# Patient Record
Sex: Male | Born: 1945 | Race: Black or African American | Hispanic: No | Marital: Married | Smoking: Never smoker
Health system: Southern US, Community
[De-identification: ages and names within clinical notes are randomized; demographics above are authoritative.]

## PROBLEM LIST (undated history)

## (undated) DIAGNOSIS — C801 Malignant (primary) neoplasm, unspecified: Secondary | ICD-10-CM

## (undated) DIAGNOSIS — I10 Essential (primary) hypertension: Secondary | ICD-10-CM

## (undated) HISTORY — PX: PROSTATE SURGERY: SHX751

## (undated) HISTORY — DX: Essential (primary) hypertension: I10

## (undated) HISTORY — DX: Malignant (primary) neoplasm, unspecified: C80.1

---

## 1999-08-27 ENCOUNTER — Emergency Department (HOSPITAL_COMMUNITY): Admission: EM | Admit: 1999-08-27 | Discharge: 1999-08-27 | Payer: Self-pay | Admitting: Internal Medicine

## 2000-04-17 ENCOUNTER — Emergency Department (HOSPITAL_COMMUNITY): Admission: EM | Admit: 2000-04-17 | Discharge: 2000-04-17 | Payer: Self-pay | Admitting: Emergency Medicine

## 2005-07-19 ENCOUNTER — Ambulatory Visit: Admission: RE | Admit: 2005-07-19 | Discharge: 2005-09-28 | Payer: Self-pay | Admitting: Radiation Oncology

## 2007-01-31 ENCOUNTER — Ambulatory Visit: Payer: Self-pay | Admitting: Gastroenterology

## 2007-02-12 ENCOUNTER — Encounter: Payer: Self-pay | Admitting: Gastroenterology

## 2007-02-12 ENCOUNTER — Ambulatory Visit: Payer: Self-pay | Admitting: Gastroenterology

## 2007-02-12 DIAGNOSIS — D126 Benign neoplasm of colon, unspecified: Secondary | ICD-10-CM

## 2007-02-12 HISTORY — DX: Benign neoplasm of colon, unspecified: D12.6

## 2007-09-24 DIAGNOSIS — K921 Melena: Secondary | ICD-10-CM

## 2007-09-24 DIAGNOSIS — Z8546 Personal history of malignant neoplasm of prostate: Secondary | ICD-10-CM

## 2007-09-24 DIAGNOSIS — I10 Essential (primary) hypertension: Secondary | ICD-10-CM

## 2007-09-24 DIAGNOSIS — M129 Arthropathy, unspecified: Secondary | ICD-10-CM | POA: Insufficient documentation

## 2007-09-24 HISTORY — DX: Arthropathy, unspecified: M12.9

## 2007-09-24 HISTORY — DX: Essential (primary) hypertension: I10

## 2007-09-24 HISTORY — DX: Melena: K92.1

## 2007-09-24 HISTORY — DX: Personal history of malignant neoplasm of prostate: Z85.46

## 2010-01-14 ENCOUNTER — Encounter: Payer: Self-pay | Admitting: Gastroenterology

## 2010-07-22 NOTE — Letter (Signed)
Summary: Colonoscopy Letter  [facility]  [address] [place], [place] [address]   Phone: [phone]  Fax: [phone]      January 14, 2010 MRN: [id number]   WACEY ZIEGER [address] [place], [place]  [address]   Dear Mr. ANZALONE,   According to your medical record, it is time for you to schedule a Colonoscopy. The American Cancer Society recommends this procedure as a method to detect early colon cancer. Patients with a family history of colon cancer, or a personal history of colon polyps or inflammatory bowel disease are at increased risk.  This letter has been generated based on the recommendations made at the time of your procedure. If you feel that in your particular situation this may no longer apply, please contact our office.  Please call our office at [phone] to schedule this appointment or to update your records at your earliest convenience.  Thank you for cooperating with [place] to provide you with the very best care possible.   Sincerely,  Rachael Fee, M.D.   [facility] [phone]

## 2010-11-02 NOTE — Assessment & Plan Note (Signed)
[facility]                         GASTROENTEROLOGY OFFICE NOTE   David, Rios                       MRN:          [id number]  DATE:01/31/2007                            DOB:          08-04-1945    REFERRING PHYSICIAN:  Jonita Albee, M.D.   Dr. Perrin Maltese asked me to evaluate David Rios in consultation regarding heme-  positive stool.   HISTORY OF PRESENT ILLNESS:  [name] is a very pleasant 65 year old  gentleman who believes he had a colonoscopy in 2002.  This was done at  [facility].  He does not remember the name of the physician, but he  was under the understanding that it was normal.  Since then, he was  diagnosed with prostate cancer approximately 3 to 4 years ago.  He  underwent surgery and radiation to his prostate bed.  He has never seen  overt blood in his stool.  He has no constipation or diarrhea.  He went  in for a routine physical and was found to have heme-positive stool.   REVIEW OF SYSTEMS:  Notable for stable weight.  Otherwise essentially  normal and is available on his nursing intake sheet.   PAST MEDICAL HISTORY:  1. Hypertension.  2. Arthritis.  3. Prostate cancer, as above, status post surgery and radiation      approximately 3 years ago.   CURRENT MEDICATIONS:  Lisinopril and aspirin.   ALLERGIES:  NO KNOWN DRUG ALLERGIES.   SOCIAL HISTORY:  Married with 5 children.  Retired from the [employer].  Nonsmoker, nondrinker.   FAMILY HISTORY:  No colon cancer, colon polyps.  His brother had  prostate cancer.   PHYSICAL EXAMINATION:  VITAL SIGNS:  6 feet 0 inches, 189 pounds.  Blood  pressure 120/76, pulse 68.  CONSTITUTIONAL:  Generally well-appearing.  NEUROLOGIC:  Alert and oriented x3.  HEENT:  Extraocular movements intact.  Oropharynx moist with no lesions.  NECK:  Supple.  No lymphadenopathy.  CARDIOVASCULAR:  Heart was regular rate and rhythm.  LUNGS:  Clear to auscultation bilaterally.  ABDOMEN:  Soft, nontender, nondistended.  Normal bowel sounds.  EXTREMITIES:  No lower extremity edema.  SKIN:  No rashes or lesions of his lower extremities.   ASSESSMENT AND PLAN:  65 year old man with recent heme-positive stool.   He did have what sounds like a colonoscopy 5-6 years ago.  I do not see  any reports on the [employer], and I also do not find any  documentation of this in E-chart.  He does not remember who did it but  believes that it was done at [facility].  Either way, he has  heme-positive stool, and it was about 5-6 years from the time of that  colonoscopy so we should repeat it.  If it is normal at this time, then  he would not need another one for 10 years given that he is at average  risk for colorectal cancer.  It is definitely possible that he has some  minor radiation damage from the prostate radiation that could account  for his  heme-positive stool.     Rachael Fee, MD  Electronically Signed    DPJ/MedQ  DD: 01/31/2007  DT: 02/01/2007  Job #: [id number]   cc:   Jonita Albee, M.D.

## 2011-08-25 DIAGNOSIS — C61 Malignant neoplasm of prostate: Secondary | ICD-10-CM | POA: Diagnosis not present

## 2011-09-05 DIAGNOSIS — N35919 Unspecified urethral stricture, male, unspecified site: Secondary | ICD-10-CM | POA: Diagnosis not present

## 2011-09-05 DIAGNOSIS — C61 Malignant neoplasm of prostate: Secondary | ICD-10-CM | POA: Diagnosis not present

## 2011-09-05 DIAGNOSIS — R3129 Other microscopic hematuria: Secondary | ICD-10-CM | POA: Diagnosis not present

## 2011-09-05 DIAGNOSIS — R319 Hematuria, unspecified: Secondary | ICD-10-CM | POA: Diagnosis not present

## 2011-10-03 ENCOUNTER — Ambulatory Visit (INDEPENDENT_AMBULATORY_CARE_PROVIDER_SITE_OTHER): Payer: Medicare Other | Admitting: Internal Medicine

## 2011-10-03 ENCOUNTER — Encounter: Payer: Self-pay | Admitting: Internal Medicine

## 2011-10-03 VITALS — BP 144/86 | HR 63 | Temp 98.0°F | Resp 16 | Ht 70.75 in | Wt 184.2 lb

## 2011-10-03 DIAGNOSIS — E785 Hyperlipidemia, unspecified: Secondary | ICD-10-CM

## 2011-10-03 DIAGNOSIS — Z79899 Other long term (current) drug therapy: Secondary | ICD-10-CM | POA: Diagnosis not present

## 2011-10-03 DIAGNOSIS — I10 Essential (primary) hypertension: Secondary | ICD-10-CM

## 2011-10-03 MED ORDER — LISINOPRIL 5 MG PO TABS: 5.0000 mg | ORAL_TABLET | Freq: Every day | ORAL | Status: DC

## 2011-10-03 NOTE — Progress Notes (Signed)
  Subjective:    Patient ID: David Rios, male    DOB: Sep 29, 1945, 66 y.o.   MRN: [id number]  HPI HTN well controlled, Exercises more No problems   Review of Systems stable   Objective:   Physical Exam  Constitutional: He is oriented to person, place, and time. He appears well-developed and well-nourished.  Neck: Normal range of motion. No thyromegaly present.  Cardiovascular: Normal rate, regular rhythm and normal heart sounds.   Pulmonary/Chest: Effort normal and breath sounds normal.  Abdominal: Soft. There is no tenderness.  Neurological: He is alert and oriented to person, place, and time.  Psychiatric: He has a normal mood and affect.          Assessment & Plan:  HTN OK Prostate Cancer recent hematuria under eval by urology CPE

## 2011-10-04 LAB — CBC WITH DIFFERENTIAL/PLATELET
Basophils Absolute: 0 10*3/uL (ref 0.0–0.1)
Basophils Relative: 1 % (ref 0–1)
Eosinophils Absolute: 0.2 10*3/uL (ref 0.0–0.7)
Hemoglobin: 13 g/dL (ref 13.0–17.0)
MCH: 29 pg (ref 26.0–34.0)
MCHC: 32.7 g/dL (ref 30.0–36.0)
Monocytes Absolute: 0.5 10*3/uL (ref 0.1–1.0)
Monocytes Relative: 14 % — ABNORMAL HIGH (ref 3–12)
Neutro Abs: 2.3 10*3/uL (ref 1.7–7.7)
Neutrophils Relative %: 64 % (ref 43–77)
RDW: 13.2 % (ref 11.5–15.5)

## 2011-10-04 LAB — COMPREHENSIVE METABOLIC PANEL
ALT: 16 U/L (ref 0–53)
AST: 19 U/L (ref 0–37)
Albumin: 4.4 g/dL (ref 3.5–5.2)
Alkaline Phosphatase: 79 U/L (ref 39–117)
Calcium: 9.3 mg/dL (ref 8.4–10.5)
Chloride: 106 mEq/L (ref 96–112)
Potassium: 4.6 mEq/L (ref 3.5–5.3)
Sodium: 142 mEq/L (ref 135–145)
Total Protein: 6.7 g/dL (ref 6.0–8.3)

## 2011-10-13 DIAGNOSIS — C61 Malignant neoplasm of prostate: Secondary | ICD-10-CM | POA: Diagnosis not present

## 2012-01-02 ENCOUNTER — Encounter: Payer: Medicare Other | Admitting: Internal Medicine

## 2012-01-12 DIAGNOSIS — C61 Malignant neoplasm of prostate: Secondary | ICD-10-CM | POA: Diagnosis not present

## 2012-01-16 ENCOUNTER — Encounter: Payer: Medicare Other | Admitting: Internal Medicine

## 2012-02-15 ENCOUNTER — Telehealth: Payer: Self-pay | Admitting: Gastroenterology

## 2012-02-15 ENCOUNTER — Encounter: Payer: Self-pay | Admitting: Gastroenterology

## 2012-02-15 NOTE — Telephone Encounter (Signed)
Recall Project: No contact with patient, letter mailed

## 2012-02-23 DIAGNOSIS — C61 Malignant neoplasm of prostate: Secondary | ICD-10-CM | POA: Diagnosis not present

## 2012-03-26 ENCOUNTER — Encounter: Payer: Medicare Other | Admitting: Internal Medicine

## 2012-06-07 DIAGNOSIS — C61 Malignant neoplasm of prostate: Secondary | ICD-10-CM | POA: Diagnosis not present

## 2012-07-05 DIAGNOSIS — C61 Malignant neoplasm of prostate: Secondary | ICD-10-CM | POA: Diagnosis not present

## 2012-07-16 ENCOUNTER — Encounter: Payer: Self-pay | Admitting: Internal Medicine

## 2012-07-16 ENCOUNTER — Ambulatory Visit (INDEPENDENT_AMBULATORY_CARE_PROVIDER_SITE_OTHER): Payer: Medicare Other | Admitting: Internal Medicine

## 2012-07-16 VITALS — BP 148/86 | HR 75 | Temp 97.7°F | Resp 16 | Ht 70.0 in | Wt 184.0 lb

## 2012-07-16 DIAGNOSIS — I10 Essential (primary) hypertension: Secondary | ICD-10-CM | POA: Diagnosis not present

## 2012-07-16 DIAGNOSIS — C61 Malignant neoplasm of prostate: Secondary | ICD-10-CM

## 2012-07-16 DIAGNOSIS — Z7189 Other specified counseling: Secondary | ICD-10-CM

## 2012-07-16 DIAGNOSIS — Z0000 Encounter for general adult medical examination without abnormal findings: Secondary | ICD-10-CM | POA: Diagnosis not present

## 2012-07-16 DIAGNOSIS — Z79899 Other long term (current) drug therapy: Secondary | ICD-10-CM

## 2012-07-16 LAB — CBC WITH DIFFERENTIAL/PLATELET
Basophils Absolute: 0 10*3/uL (ref 0.0–0.1)
Eosinophils Absolute: 0.1 10*3/uL (ref 0.0–0.7)
Eosinophils Relative: 4 % (ref 0–5)
Lymphs Abs: 0.8 10*3/uL (ref 0.7–4.0)
MCH: 30.3 pg (ref 26.0–34.0)
MCV: 83.9 fL (ref 78.0–100.0)
Neutrophils Relative %: 66 % (ref 43–77)
Platelets: 202 10*3/uL (ref 150–400)
RBC: 4.59 MIL/uL (ref 4.22–5.81)
RDW: 14.3 % (ref 11.5–15.5)
WBC: 3.9 10*3/uL — ABNORMAL LOW (ref 4.0–10.5)

## 2012-07-16 LAB — LIPID PANEL
Cholesterol: 190 mg/dL (ref 0–200)
HDL: 46 mg/dL (ref >39)
LDL Cholesterol: 132 mg/dL — ABNORMAL HIGH (ref 0–99)
Total CHOL/HDL Ratio: 4.1 Ratio
Triglycerides: 60 mg/dL (ref <150)
VLDL: 12 mg/dL (ref 0–40)

## 2012-07-16 LAB — POCT URINALYSIS DIPSTICK
Nitrite, UA: NEGATIVE
Protein, UA: NEGATIVE
Urobilinogen, UA: 0.2
pH, UA: 5

## 2012-07-16 LAB — POCT UA - MICROSCOPIC ONLY
Casts, Ur, LPF, POC: NEGATIVE
Epithelial cells, urine per micros: NEGATIVE
Mucus, UA: NEGATIVE
Yeast, UA: NEGATIVE

## 2012-07-16 LAB — COMPREHENSIVE METABOLIC PANEL
ALT: 17 U/L (ref 0–53)
AST: 23 U/L (ref 0–37)
Alkaline Phosphatase: 82 U/L (ref 39–117)
Creat: 1.17 mg/dL (ref 0.50–1.35)
Sodium: 141 mEq/L (ref 135–145)
Total Bilirubin: 0.9 mg/dL (ref 0.3–1.2)
Total Protein: 7.4 g/dL (ref 6.0–8.3)

## 2012-07-16 MED ORDER — LISINOPRIL 10 MG PO TABS: 10.0000 mg | ORAL_TABLET | Freq: Every day | ORAL | Status: DC

## 2012-07-16 NOTE — Progress Notes (Signed)
  Subjective:    Patient ID: David Rios, male    DOB: [birth year], 67 y.o.   MRN: [id number]  HPI Feels good. Prostate cancer shot once per month, has sweats as side affect. No other problems.  Able to get erections.  Review of Systems  Constitutional: Positive for diaphoresis.  HENT: Negative.   Eyes: Negative.   Respiratory: Negative.   Cardiovascular: Negative.   Gastrointestinal: Negative.   Genitourinary: Negative for urgency, decreased urine volume, enuresis and difficulty urinating.  Musculoskeletal: Negative.   Neurological: Negative.   Psychiatric/Behavioral: Negative.        Objective:   Physical Exam  Constitutional: He is oriented to person, place, and time. He appears well-developed and well-nourished. No distress.  HENT:  Right Ear: External ear normal.  Left Ear: External ear normal.  Nose: Nose normal.  Mouth/Throat: Oropharynx is clear and moist.  Neck: Normal range of motion. Neck supple.  Cardiovascular: Normal rate, regular rhythm, normal heart sounds and intact distal pulses.   Pulmonary/Chest: Effort normal and breath sounds normal.  Abdominal: Soft. Bowel sounds are normal.  Genitourinary: Rectum normal, prostate normal and penis normal. Right testis shows no mass and no tenderness. Left testis shows no mass and no tenderness. Uncircumcised.       Testes are atrophied  Musculoskeletal: Normal range of motion.  Neurological: He is alert and oriented to person, place, and time. He has normal reflexes. No cranial nerve deficit. He exhibits normal muscle tone. Coordination normal.  Skin: Skin is warm and dry.  Psychiatric: He has a normal mood and affect. His behavior is normal. Judgment and thought content normal.   Results for orders placed in visit on 07/16/12  POCT UA - MICROSCOPIC ONLY      Component Value Range   WBC, Ur, HPF, POC neg     RBC, urine, microscopic 0-2     Bacteria, U Microscopic trace     Mucus, UA neg     Epithelial cells,  urine per micros neg     Crystals, Ur, HPF, POC neg     Casts, Ur, LPF, POC neg     Yeast, UA neg    POCT URINALYSIS DIPSTICK      Component Value Range   Color, UA yellow     Clarity, UA clear     Glucose, UA neg     Bilirubin, UA neg     Ketones, UA neg     Spec Grav, UA >=1.030     Blood, UA trace     pH, UA 5.0     Protein, UA neg     Urobilinogen, UA 0.2     Nitrite, UA neg     Leukocytes, UA Negative            Assessment & Plan:  Prostate cancer controlled HTN slipping/ incr lisinopril to 10mg  qd RF meds 1 year

## 2012-07-16 NOTE — Progress Notes (Signed)
  Subjective:    Patient ID: David Rios, male    DOB: [birth year], 67 y.o.   MRN: [id number]  HPI    Review of Systems  Constitutional: Positive for diaphoresis.  HENT: Negative.   Eyes: Negative.   Respiratory: Negative.   Cardiovascular: Negative.   Gastrointestinal: Negative.   Genitourinary: Negative.   Musculoskeletal: Negative.   Neurological: Negative.   Hematological: Negative.   Psychiatric/Behavioral: Negative.        Objective:   Physical Exam        Assessment & Plan:

## 2012-07-16 NOTE — Patient Instructions (Addendum)

## 2012-10-11 DIAGNOSIS — C61 Malignant neoplasm of prostate: Secondary | ICD-10-CM | POA: Diagnosis not present

## 2012-11-08 DIAGNOSIS — C61 Malignant neoplasm of prostate: Secondary | ICD-10-CM | POA: Diagnosis not present

## 2013-02-20 DIAGNOSIS — C61 Malignant neoplasm of prostate: Secondary | ICD-10-CM | POA: Diagnosis not present

## 2013-03-08 ENCOUNTER — Ambulatory Visit: Payer: Medicare Other

## 2013-03-08 ENCOUNTER — Ambulatory Visit (INDEPENDENT_AMBULATORY_CARE_PROVIDER_SITE_OTHER): Payer: Medicare Other | Admitting: Internal Medicine

## 2013-03-08 VITALS — BP 154/80 | HR 79 | Temp 97.1°F | Resp 18 | Ht 71.5 in | Wt 188.0 lb

## 2013-03-08 DIAGNOSIS — M779 Enthesopathy, unspecified: Secondary | ICD-10-CM

## 2013-03-08 DIAGNOSIS — M7150 Other bursitis, not elsewhere classified, unspecified site: Secondary | ICD-10-CM | POA: Diagnosis not present

## 2013-03-08 DIAGNOSIS — M25519 Pain in unspecified shoulder: Secondary | ICD-10-CM | POA: Diagnosis not present

## 2013-03-08 DIAGNOSIS — M719 Bursopathy, unspecified: Secondary | ICD-10-CM

## 2013-03-08 DIAGNOSIS — M25512 Pain in left shoulder: Secondary | ICD-10-CM

## 2013-03-08 MED ORDER — CELECOXIB 200 MG PO CAPS: 200.0000 mg | ORAL_CAPSULE | Freq: Two times a day (BID) | ORAL | Status: DC

## 2013-03-08 NOTE — Patient Instructions (Addendum)
Surgery for Impingement Syndrome, Subacromial Decompression Subacromial decompression surgery is for patients with rotator cuff tendinitis, subacromial bursitis (inflamed fluid filled sac between the shoulder joint and top of the shoulder blade), or impingement syndrome (inflamed rotator cuff tendons, due to pinching). Surgery is for patients with continued shoulder pain, despite at least 3 months of rehabilitation treatment. The shoulder pain is so severe that it affects patients' daily activities or greatly decreases their quality of life. Patients who will benefit most from surgery are those whose shoulder bone (acromion) has a curve, hook, or bump (spur), or those who have a partial rotator cuff tear. There are 3 purposes of surgery. First, the inflamed bursa is removed. Second, the shoulder bone defect (curve, hook, spur) is removed. Third, the coracoacromial ligament is cut. This surgery is intended to reduce pain, by increasing space in the area, so that the rotator cuff is less likely to be pinched. REASONS NOT TO OPERATE   Infection of the shoulder joint.  Patient is unable or unwilling to complete the postoperative program. This includes keeping the shoulder in a sling or immobilizer (if open surgery is performed), or performing the needed rehabilitation.  Emotional or psychological conditions that contribute to the shoulder condition.  Patients who have rotator cuff inflammation due to other causes. This includes impingement caused by shoulder instability, weak shoulder blade muscles (scapula), shoulder arthritis, stiff or frozen shoulder, or a large os acromionale (failure of the shoulder bone growth plates to fuse properly). RISKS AND COMPLICATIONS   Infection.  Bleeding.  Injury to nerves (numbness, weakness, paralysis).  Continued or recurring pain.  Detachment of the deltoid shoulder muscle (if open surgery is performed).  Stiffness or loss of shoulder motion.  Decrease in  athletic performance.  Shoulder weakness.  Fracture of the shoulder bone.  Pain in the joint connecting the shoulder bone and collarbone.  Removal of too much or too little shoulder bone. TECHNIQUE Technique used may vary between surgeons. In general, the surgery is performed with a flexible tube and tools inserted in a few small slits near the joint (arthroscopic). It may also be completed through an open cut (incision). The goal of the procedure is to remove the bursa, remove the shoulder bone deformity, and cut the coracoacromial ligament. Electricity will be used to sear the small capillaries (cauterize), to stop small amounts of bleeding. Other tools used are an electric or motorized shaver, to remove the bursa, and a small power drill (burr) to remove the deformity of the shoulder bone.  If the procedure is completed with an open incision, the surgeon will detach the deltoid shoulder muscle from the shoulder bone and cut the coracoacromial ligament. The deformity of the shoulder bone is then removed, using a saw or chisel (osteotome). A file (rasp) may be used to smooth the edges. Finally, the bursa is removed with scissors, and the deltoid muscle is reattached to the shoulder bone.  HOME CARE INSTRUCTIONS   Postoperative care depends on the surgical technique used (arthroscopic or open).  Follow the instructions given to you by your surgeon.  Keep the wound clean and dry for 10 to 14 days after surgery, especially if open surgery is performed.  Wear a sling, brace, or immobilizer as prescribed by your surgeon. This often lasts a couple days for arthroscopic procedures, or 6 to 8 weeks for open procedures, because the deltoid muscle must heal.  You will be given pain medicines by your caregiver or surgeon. Take only as much medicine  as you need.  You may be advised to perform motion exercises immediately after surgery. These may be performed at home or with a therapist.  Postoperative  rehabilitation and exercises are very important to regain motion, and later, strength. RETURN TO SPORTS   6 weeks is the minimum waiting time required before returning to play. Open procedure surgeries are often longer.  Return to sports depends on the type of sport and the position played.  A therapist must assess your strength and range of motion before athletics may be resumed. SEEK MEDICAL CARE IF:   You experience pain, numbness, or coldness in the hand.  Blue, gray, or dark color appears in the fingernails.  Any of the following occur after surgery:  Increased pain, swelling, redness, drainage of fluids, or bleeding in the affected area.  Signs of infection (headache, muscle aches, dizziness, a general ill feeling with fever).  New, unexplained symptoms develop. (Drugs used in treatment may produce side effects.) Do not eat or drink anything before surgery. Solid food makes general anesthesia more hazardous.  Document Released: 06/06/2005 Document Revised: 08/29/2011 Document Reviewed: 09/18/2008 [facility] Patient Information 2014 [place], [place]. Adhesive Capsulitis Sometimes the shoulder becomes stiff and is painful to move. Some people say it feels as if the shoulder is frozen in place. Because of this, the condition is called "frozen shoulder." Its medical name is adhesive capsulitis.  The shoulder joint is made up of strong connective tissue that attaches the ball of the humerus to the shallow shoulder socket. This strong connective tissue is called the joint capsule. This tissue can become stiff and swollen. That is when adhesive capsulitis sets in. CAUSES  It is not always clear just what the cause adhesive capsulitis. Possibilities include:  Injury to the shoulder joint.  Strain. This is a repetitive injury brought about by overuse.  Lack of use. Perhaps your arm or hand was otherwise injured. It might have been in a sling for awhile. Or perhaps you were not using it to  avoid pain.  Referred pain. This is a sort of trick the body plays. You feel pain in the shoulder. But, the pain actually comes from an injury somewhere else in the body.  Long-standing health problems. Several diseases can cause adhesive capsulitis. They include diabetes, heart disease, stroke, thyroid problems, rheumatoid arthritis and lung disease.  Being a women older than 40. Anyone can develop adhesive capsulitis but it is most common in women in this age group. SYMPTOMS   Pain.  It occurs when the arm is moved.  Parts of the shoulder might hurt if they are touched.  Pain is worse at night or when resting.  Soreness. It might not be strong enough to be called pain. But, the shoulder aches.  The shoulder does not move freely.  Muscle spasms.  Trouble sleeping because of shoulder ache or pain. DIAGNOSIS  To decide if you have adhesive capsulitis, your healthcare provider will probably:  Ask about symptoms you have noticed.  Ask about your history of joint pain and anything that might have caused the pain.  Ask about your overall health.  Use hands to feel your shoulder and neck.  Ask you to move your shoulder in specific directions. This may indicate the origin of the pain.  Order imaging tests; pictures of the shoulder. They help pinpoint the source of the problem. An X-ray might be used. For more detail, an MRI is often used. An MRI details the tendons, muscles and ligaments as  well as the joint. TREATMENT  Adhesive capsulitis can be treated several ways. Most treatments can be done in a clinic or in your healthcare provider's office. Be sure to discuss the different options with your caregiver. They include:  Physical therapy. You will work on specific exercises to get your shoulder moving again. The exercises usually involve stretching. A physical therapist (a caregiver with special training) can show you what to do and what not to do. The exercises will need to be  done daily.  Medication.  Over-the-counter medicines may relieve pain and inflammation (the body's way of reacting to injury or infection).  Corticosteroids. These are stronger drugs to reduce pain and inflammation. They are given by injection (shots) into the shoulder joint. Frequent treatment is not recommended.  Muscle relaxants. Medication may be prescribed to ease muscle spasms.  Treatment of underlying conditions. This means treating another condition that is causing your shoulder problem. This might be a rotator cuff (tendon) problem  Shoulder manipulation. The shoulder will be moved by your healthcare provider. You would be under general anesthesia (given a drug that puts you to sleep). You would not feel anything. Sometimes the joint will be injected with salt water (saline) at high pressure to break down internal scarring in the joint capsule.  Surgery. This is rarely needed. It may be suggested in advanced cases after all other treatment has failed. PROGNOSIS  In time, most people recover from adhesive capsulitis. Sometimes, however, the pain goes away but full movement of the shoulder does not return.  HOME CARE INSTRUCTIONS   Take any pain medications recommended by your healthcare provider. Follow the directions carefully.  If you have physical therapy, follow through with the therapist's suggestions. Be sure you understand the exercises you will be doing. You should understand:  How often the exercises should be done.  How many times each exercise should be repeated.  How long they should be done.  What other activities you should do, or not do.  That you should warm up before doing any exercise. Just 5 to 10 minutes will help. Small, gentle movements should get your shoulder ready for more.  Avoid high-demand exercise that involves your shoulder such as throwing. This type of exercise can make pain worse.  Consider using cold packs. Cold may ease swelling and pain.  Ask your healthcare provider if a cold pack might help you. If so, get directions on how and when to use them. SEEK MEDICAL CARE IF:   You have any questions about your medications.  Your pain continues to increase. Document Released: 04/03/2009 Document Revised: 08/29/2011 Document Reviewed: 04/03/2009 [facility] Patient Information 2014 [place], [place]. Bursitis Bursitis is a swelling and soreness (inflammation) of a fluid-filled sac (bursa) that overlies and protects a joint. It can be caused by injury, overuse of the joint, arthritis or infection. The joints most likely to be affected are the elbows, shoulders, hips and knees. HOME CARE INSTRUCTIONS   Apply ice to the affected area for 15-20 minutes each hour while awake for 2 days. Put the ice in a plastic bag and place a towel between the bag of ice and your skin.  Rest the injured joint as much as possible, but continue to put the joint through a full range of motion, 4 times per day. (The shoulder joint especially becomes rapidly "frozen" if not used.) When the pain lessens, begin normal slow movements and usual activities.  Only take over-the-counter or prescription medicines for pain, discomfort or fever  as directed by your caregiver.  Your caregiver may recommend draining the bursa and injecting medicine into the bursa. This may help the healing process.  Follow all instructions for follow-up with your caregiver. This includes any orthopedic referrals, physical therapy and rehabilitation. Any delay in obtaining necessary care could result in a delay or failure of the bursitis to heal and chronic pain. SEEK IMMEDIATE MEDICAL CARE IF:   Your pain increases even during treatment.  You develop an oral temperature above 102 F (38.9 C) and have heat and inflammation over the involved bursa. MAKE SURE YOU:   Understand these instructions.  Will watch your condition.  Will get help right away if you are not doing well or get  worse. Document Released: 06/03/2000 Document Revised: 08/29/2011 Document Reviewed: 05/08/2009 [facility] Patient Information 2014 [place], [place].

## 2013-03-08 NOTE — Progress Notes (Signed)
  Subjective:    Patient ID: David Rios, male    DOB: 1946/03/27, 67 y.o.   MRN: [id number]  HPI 67 y.o. Male presents to clinic with left shoulder pain. Is having trouble lifting arm. Pain is constant. Arthritis pain. Use to take celebrex in the past. Take aleve when pain gets to bad. When laying down at night the pain is bothersome. Compares it to a tooth ache that does not go away . 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 Denies any previous injuries or surgeries to left shoulder .   Review of Systems     Objective:   Physical Exam        Assessment & Plan:

## 2013-03-08 NOTE — Progress Notes (Signed)
  Subjective:    Patient ID: YANKY VANDERBURG, male    DOB: Jan 16, 1946, 67 y.o.   MRN: [id number]  HPI Progressive pain in left shoulder for months, has lost strength and ROM. No sensory loss, never xred and no assoc heart sxs.   Review of Systems See e chart/feels good now    Objective:   Physical Exam  Constitutional: He is oriented to person, place, and time. He appears well-developed and well-nourished.  HENT:  Head: Normocephalic.  Pulmonary/Chest: Effort normal.  Musculoskeletal: He exhibits tenderness. He exhibits no edema.       Left shoulder: He exhibits decreased range of motion, tenderness, bony tenderness and pain. He exhibits no swelling, no effusion, no crepitus, no deformity, no laceration, no spasm and normal pulse.  Neurological: He is alert and oriented to person, place, and time. No cranial nerve deficit. He exhibits normal muscle tone. Coordination normal.  Skin: No rash noted.  Psychiatric: He has a normal mood and affect.   [facility] reading (PRIMARY) by  Dr Perrin Maltese normal shoulder xr         Assessment & Plan:  Left shoulder pain/Bursitis/tendonitis Sling/Ice/Celebrex 200mg

## 2013-03-13 ENCOUNTER — Telehealth: Payer: Self-pay

## 2013-03-13 NOTE — Telephone Encounter (Signed)
What paperwork? Called patient. Left message for patient to call me back, to find out what the forms are. Dala Dock will check and see if she has anything.

## 2013-03-13 NOTE — Telephone Encounter (Signed)
David Rios does not have anything. Will await patient call to determine what he is referring to.

## 2013-03-13 NOTE — Telephone Encounter (Signed)
Patients would like to know if Dr. Perrin Maltese has completed the paperwork for him from [facility].    Please call [phone]

## 2013-03-14 NOTE — Telephone Encounter (Signed)
Spoke to patient, he states forms are for a reassignment of his work status. His current job is contributing to his shoulder pain. He will have the forms resent

## 2013-03-20 ENCOUNTER — Telehealth: Payer: Self-pay

## 2013-03-20 NOTE — Telephone Encounter (Signed)
Patient returning amy's call. Cb# [phone]

## 2013-03-20 NOTE — Telephone Encounter (Signed)
He is having forms sent from his job at [employer]. Have you gotten these? 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

## 2013-03-21 DIAGNOSIS — C61 Malignant neoplasm of prostate: Secondary | ICD-10-CM | POA: Diagnosis not present

## 2013-03-28 ENCOUNTER — Telehealth: Payer: Self-pay | Admitting: Radiology

## 2013-03-28 ENCOUNTER — Ambulatory Visit (INDEPENDENT_AMBULATORY_CARE_PROVIDER_SITE_OTHER): Payer: Medicare Other | Admitting: Internal Medicine

## 2013-03-28 VITALS — BP 120/80 | HR 64 | Temp 98.2°F | Resp 16 | Ht 70.75 in | Wt 186.0 lb

## 2013-03-28 DIAGNOSIS — M25519 Pain in unspecified shoulder: Secondary | ICD-10-CM

## 2013-03-28 DIAGNOSIS — M7502 Adhesive capsulitis of left shoulder: Secondary | ICD-10-CM

## 2013-03-28 DIAGNOSIS — M25512 Pain in left shoulder: Secondary | ICD-10-CM

## 2013-03-28 DIAGNOSIS — M7500 Adhesive capsulitis of unspecified shoulder: Secondary | ICD-10-CM

## 2013-03-28 NOTE — Progress Notes (Signed)
  Subjective:    Patient ID: David Rios, male    DOB: [birth year], 67 y.o.   MRN: [id number]  HPI Shoulder pain and loss of rom not improved and still painful.   Review of Systems     Objective:   Physical Exam Left shoulder decreased abduction, passive also decreased.       Assessment & Plan:  Frozen shoulder Refer to Dr. Althea Charon tomorrow 230 pm/appt made by me.

## 2013-03-28 NOTE — Telephone Encounter (Signed)
Patient relates cold temperatures increase his shoulder pain, he is asking for out of work (disability forms to be filled out) to be out from Sept 19 until Oct 19th. No work note was provided at office visit, and you did not take him out of work, please advise.

## 2013-03-29 ENCOUNTER — Telehealth: Payer: Self-pay

## 2013-03-29 DIAGNOSIS — M67919 Unspecified disorder of synovium and tendon, unspecified shoulder: Secondary | ICD-10-CM | POA: Diagnosis not present

## 2013-03-29 DIAGNOSIS — M25519 Pain in unspecified shoulder: Secondary | ICD-10-CM | POA: Diagnosis not present

## 2013-03-29 DIAGNOSIS — M25819 Other specified joint disorders, unspecified shoulder: Secondary | ICD-10-CM | POA: Diagnosis not present

## 2013-03-29 NOTE — Telephone Encounter (Signed)
Pt has an appt with [facility] at 230 and is needing a copy of his xrays of left shoulder   Please call when ready to pick up [phone] pt states he will come by around [phone] to pick them up

## 2013-03-29 NOTE — Telephone Encounter (Signed)
Request given to xray 

## 2013-04-02 NOTE — Telephone Encounter (Signed)
Patient needs to come in for forms to be completed, or he can have ortho do these for him. Let message to advise.

## 2013-04-03 ENCOUNTER — Telehealth: Payer: Self-pay

## 2013-04-03 NOTE — Telephone Encounter (Signed)
Disability ppw in Dr Perrin Maltese box.

## 2013-04-09 ENCOUNTER — Telehealth: Payer: Self-pay

## 2013-04-19 DIAGNOSIS — M25519 Pain in unspecified shoulder: Secondary | ICD-10-CM | POA: Diagnosis not present

## 2013-04-22 DIAGNOSIS — M25519 Pain in unspecified shoulder: Secondary | ICD-10-CM | POA: Diagnosis not present

## 2013-04-25 DIAGNOSIS — M25519 Pain in unspecified shoulder: Secondary | ICD-10-CM | POA: Diagnosis not present

## 2013-04-29 DIAGNOSIS — M25519 Pain in unspecified shoulder: Secondary | ICD-10-CM | POA: Diagnosis not present

## 2013-04-29 DIAGNOSIS — M67919 Unspecified disorder of synovium and tendon, unspecified shoulder: Secondary | ICD-10-CM | POA: Diagnosis not present

## 2013-05-02 DIAGNOSIS — M25519 Pain in unspecified shoulder: Secondary | ICD-10-CM | POA: Diagnosis not present

## 2013-05-10 ENCOUNTER — Encounter: Payer: Self-pay | Admitting: Internal Medicine

## 2013-06-17 NOTE — Telephone Encounter (Signed)
Cancel.

## 2013-07-04 DIAGNOSIS — C61 Malignant neoplasm of prostate: Secondary | ICD-10-CM | POA: Diagnosis not present

## 2013-07-16 ENCOUNTER — Other Ambulatory Visit: Payer: Self-pay | Admitting: Internal Medicine

## 2013-08-22 DIAGNOSIS — C61 Malignant neoplasm of prostate: Secondary | ICD-10-CM | POA: Diagnosis not present

## 2013-10-02 DIAGNOSIS — N35919 Unspecified urethral stricture, male, unspecified site: Secondary | ICD-10-CM | POA: Diagnosis not present

## 2013-11-13 ENCOUNTER — Other Ambulatory Visit: Payer: Self-pay | Admitting: Internal Medicine

## 2013-11-19 ENCOUNTER — Encounter: Payer: Self-pay | Admitting: Internal Medicine

## 2013-11-19 ENCOUNTER — Ambulatory Visit (INDEPENDENT_AMBULATORY_CARE_PROVIDER_SITE_OTHER): Payer: Medicare Other | Admitting: Internal Medicine

## 2013-11-19 VITALS — BP 130/88 | HR 72 | Temp 97.8°F | Resp 16 | Ht 70.5 in | Wt 190.6 lb

## 2013-11-19 DIAGNOSIS — I10 Essential (primary) hypertension: Secondary | ICD-10-CM

## 2013-11-19 DIAGNOSIS — Z8546 Personal history of malignant neoplasm of prostate: Secondary | ICD-10-CM

## 2013-11-19 DIAGNOSIS — Z79899 Other long term (current) drug therapy: Secondary | ICD-10-CM

## 2013-11-19 LAB — BASIC METABOLIC PANEL
BUN: 16 mg/dL (ref 6–23)
CALCIUM: 10.1 mg/dL (ref 8.4–10.5)
CO2: 29 mEq/L (ref 19–32)
Chloride: 104 mEq/L (ref 96–112)
Creat: 1.21 mg/dL (ref 0.50–1.35)
Glucose, Bld: 103 mg/dL — ABNORMAL HIGH (ref 70–99)
POTASSIUM: 4.7 meq/L (ref 3.5–5.3)
Sodium: 142 mEq/L (ref 135–145)

## 2013-11-19 MED ORDER — LISINOPRIL 10 MG PO TABS: 10.0000 mg | ORAL_TABLET | Freq: Every day | ORAL | Status: DC

## 2013-11-19 NOTE — Progress Notes (Signed)
   Subjective:    Patient ID: David Rios, male    DOB: [birth year], 68 y.o.   MRN: [id number]  HPI HTN and medication f/up. No problems and doing well.   Review of Systems     Objective:   Physical Exam        Assessment & Plan:  HTN Hx of prostate cancer RF meds 1 yr.

## 2013-11-19 NOTE — Patient Instructions (Signed)
Immunization Schedule, Adult  Influenza vaccine.  All adults should be immunized every year.  All adults, including pregnant women and people with hives-only allergy to eggs can receive the inactivated influenza (IIV) vaccine.  Adults aged 69 49 years can receive the recombinant influenza (RIV) vaccine. The RIV vaccine does not contain any egg protein.  Adults aged 65 years or older can receive the standard-dose IIV or the high-dose IIV.  Tetanus, diphtheria, and acellular pertussis (Td, Tdap) vaccine.  Pregnant women should receive 1 dose of Tdap vaccine during each pregnancy. The dose should be obtained regardless of the length of time since the last dose. Immunization is preferred during the 27th to 36th week of gestation.  An adult who has not previously received Tdap or who does not know his or her vaccine status should receive 1 dose of Tdap. This initial dose should be followed by tetanus and diphtheria toxoids (Td) booster doses every 10 years.  Adults with an unknown or incomplete history of completing a 3-dose immunization series with Td-containing vaccines should begin or complete a primary immunization series including a Tdap dose.  Adults should receive a Td booster every 10 years.  Varicella vaccine.  An adult without evidence of immunity to varicella should receive 2 doses or a second dose if he or she has previously received 1 dose.  Pregnant females who do not have evidence of immunity should receive the first dose after pregnancy. This first dose should be obtained before leaving the health care facility. The second dose should be obtained 4 8 weeks after the first dose.  Human papillomavirus (HPV) vaccine.  Females aged 13 26 years who have not received the vaccine previously should obtain the 3-dose series.  The vaccine is not recommended for use in pregnant females. However, pregnancy testing is not needed before receiving a dose. If a male is found to be  pregnant after receiving a dose, no treatment is needed. In that case, the remaining doses should be delayed until after the pregnancy.  Males aged 13 21 years who have not received the vaccine previously should receive the 3-dose series. Males aged 22 26 years may be immunized.  Immunization is recommended through the age of 26 years for any male who has sex with males and did not get any or all doses earlier.  Immunization is recommended for any person with an immunocompromised condition through the age of 26 years if he or she did not get any or all doses earlier.  During the 3-dose series, the second dose should be obtained 4 8 weeks after the first dose. The third dose should be obtained 24 weeks after the first dose and 16 weeks after the second dose.  Zoster vaccine.  One dose is recommended for adults aged 60 years or older unless certain conditions are present.  Measles, mumps, and rubella (MMR) vaccine.  Adults born before 1957 generally are considered immune to measles and mumps.  Adults born in 1957 or later should have 1 or more doses of MMR vaccine unless there is a contraindication to the vaccine or there is laboratory evidence of immunity to each of the three diseases.  A routine second dose of MMR vaccine should be obtained at least 28 days after the first dose for students attending postsecondary schools, health care workers, or international travelers.  People who received inactivated measles vaccine or an unknown type of measles vaccine during 1963 1967 should receive 2 doses of MMR vaccine.  People who received   inactivated mumps vaccine or an unknown type of mumps vaccine before 1979 and are at high risk for mumps infection should consider immunization with 2 doses of MMR vaccine.  For females of childbearing age, rubella immunity should be determined. If there is no evidence of immunity, females who are not pregnant should be vaccinated. If there is no evidence of  immunity, females who are pregnant should delay immunization until after pregnancy.  Unvaccinated health care workers born before 45 who lack laboratory evidence of measles, mumps, or rubella immunity or laboratory confirmation of disease should consider measles and mumps immunization with 2 doses of MMR vaccine or rubella immunization with 1 dose of MMR vaccine.  Pneumococcal 13-valent conjugate (PCV13) vaccine.  When indicated, a person who is uncertain of his or her immunization history and has no record of immunization should receive the PCV13 vaccine.  An adult aged 38 years or older who has certain medical conditions and has not been previously immunized should receive 1 dose of PCV13 vaccine. This PCV13 should be followed with a dose of pneumococcal polysaccharide (PPSV23) vaccine. The PPSV23 vaccine dose should be obtained at least 8 weeks after the dose of PCV13 vaccine.  An adult aged 33 years or older who has certain medical conditions and previously received 1 or more doses of PPSV23 vaccine should receive 1 dose of PCV13. The PCV13 vaccine dose should be obtained 1 or more years after the last PPSV23 vaccine dose.  Pneumococcal polysaccharide (PPSV23) vaccine.  When PCV13 is also indicated, PCV13 should be obtained first.  All adults aged 71 years and older should be immunized.  An adult younger than age 74 years who has certain medical conditions should be immunized.  Any person who resides in a nursing home or long-term care facility should be immunized.  An adult smoker should be immunized.  People with an immunocompromised condition and certain other conditions should receive both PCV13 and PPSV23 vaccines.  People with human immunodeficiency virus (HIV) infection should be immunized as soon as possible after diagnosis.  Immunization during chemotherapy or radiation therapy should be avoided.  Routine use of PPSV23 vaccine is not recommended for American Indians,  [place] Natives, or people younger than 65 years unless there are medical conditions that require PPSV23 vaccine.  When indicated, people who have unknown immunization and have no record of immunization should receive PPSV23 vaccine.  One-time revaccination 5 years after the first dose of PPSV23 is recommended for people aged 22 64 years who have chronic kidney failure, nephrotic syndrome, asplenia, or immunocompromised conditions.  People who received 1 2 doses of PPSV23 before age 69 years should receive another dose of PPSV23 vaccine at age 70 years or later if at least 5 years have passed since the previous dose.  Doses of PPSV23 are not needed for people immunized with PPSV23 at or after age 66 years.  Meningococcal vaccine.  Adults with asplenia or persistent complement component deficiencies should receive 2 doses of quadrivalent meningococcal conjugate (MenACWY-D) vaccine. The doses should be obtained at least 2 months apart.  Microbiologists working with certain meningococcal bacteria, [place] recruits, people at risk during an outbreak, and people who travel to or live in countries with a high rate of meningitis should be immunized.  A first-year college student up through age 74 years who is living in a residence hall should receive a dose if he or she did not receive a dose on or after his or her 16th birthday.  Adults who have  certain high-risk conditions should receive one or more doses of vaccine.  Hepatitis A vaccine.  Adults who wish to be protected from this disease, have certain high-risk conditions, work with hepatitis A-infected animals, work in hepatitis A research labs, or travel to or work in countries with a high rate of hepatitis A should be immunized.  Adults who were previously unvaccinated and who anticipate close contact with an international adoptee during the first 60 days after arrival in the [place] from a country with a high rate of hepatitis A should  be immunized.  Hepatitis B vaccine.  Adults who wish to be protected from this disease, have certain high-risk conditions, may be exposed to blood or other infectious body fluids, are household contacts or sex partners of hepatitis B positive people, are clients or workers in certain care facilities, or travel to or work in countries with a high rate of hepatitis B should be immunized.  Haemophilus influenzae type b (Hib) vaccine.  A previously unvaccinated person with asplenia or sickle cell disease or having a scheduled splenectomy should receive 1 dose of Hib vaccine.  Regardless of previous immunization, a recipient of a hematopoietic stem cell transplant should receive a 3-dose series 6 12 months after his or her successful transplant.  Hib vaccine is not recommended for adults with HIV infection. Document Released: 08/27/2003 Document Revised: 10/01/2012 Document Reviewed: 07/24/2012 [facility] Patient Information 2014 [place], [place].

## 2013-11-23 ENCOUNTER — Encounter: Payer: Self-pay | Admitting: *Deleted

## 2013-12-29 ENCOUNTER — Ambulatory Visit: Payer: Medicare Other

## 2013-12-29 ENCOUNTER — Ambulatory Visit (INDEPENDENT_AMBULATORY_CARE_PROVIDER_SITE_OTHER): Payer: Medicare Other | Admitting: Internal Medicine

## 2013-12-29 VITALS — BP 156/88 | HR 77 | Temp 97.6°F | Resp 15 | Ht 70.5 in | Wt 188.6 lb

## 2013-12-29 DIAGNOSIS — R209 Unspecified disturbances of skin sensation: Secondary | ICD-10-CM

## 2013-12-29 DIAGNOSIS — R202 Paresthesia of skin: Secondary | ICD-10-CM

## 2013-12-29 DIAGNOSIS — R29898 Other symptoms and signs involving the musculoskeletal system: Secondary | ICD-10-CM

## 2013-12-29 NOTE — Progress Notes (Signed)
   Subjective:    Patient ID: David Rios, male    DOB: 27-Sep-1945, 68 y.o.   MRN: [id number]  HPI  Pt presents with paresthesia of his right leg, mostly while stationary, especially while sleeping. He states this has been ongoing for about a month.  He states it was sporadic at first, but has noted an increase in symptoms over the past couple of weeks. He does describe this sensation as slightly painful. He states it is most painful with exertion. He is able to position his leg differently and this typically relieves the tingling and burning sensation. He does note a slight numbness in his right outer thigh. He states this does keep him from getting a restful nights sleep. Pt notes a "hardening" on his upper right leg. He believes this is due to a circulation issue. Pt has noticed some swelling in his right foot and ankle. He has noticed some increased weakness in the right leg. He denies pain in his groin. He denies back, hip, or knee pain. He does exercise regularly, he has not noticed increased pain with exercise.  Review of Systems     Objective:   Physical Exam  Constitutional: He is oriented to person, place, and time. He appears well-developed and well-nourished. No distress.  HENT:  Head: Normocephalic.  Eyes: EOM are normal. Pupils are equal, round, and reactive to light.  Neck: Normal range of motion. Neck supple.  Pulmonary/Chest: Effort normal.  Musculoskeletal:       Right hip: He exhibits normal range of motion, normal strength, no tenderness and no bony tenderness.       Right knee: He exhibits normal range of motion, no swelling, no effusion, no ecchymosis, normal alignment, no LCL laxity, normal patellar mobility, no bony tenderness and normal meniscus. No tenderness found. No medial joint line and no lateral joint line tenderness noted.       Lumbar back: He exhibits normal range of motion, no tenderness, no bony tenderness, no pain and no spasm.  Lymphadenopathy:    He  has no cervical adenopathy.  Neurological: He is alert and oriented to person, place, and time. No cranial nerve deficit or sensory deficit. Gait normal.   Hx of prostate cancer   [facility] reading (PRIMARY) by  Dr.Guest.LS spine mild DDD L5-S1, knee normal      Assessment & Plan:  Mild paresthesias caused by mild DDD. Normal exam Tylenol and back exercises. RTC prn

## 2013-12-29 NOTE — Patient Instructions (Signed)

## 2014-01-07 DIAGNOSIS — C61 Malignant neoplasm of prostate: Secondary | ICD-10-CM | POA: Diagnosis not present

## 2014-01-16 DIAGNOSIS — C61 Malignant neoplasm of prostate: Secondary | ICD-10-CM | POA: Diagnosis not present

## 2014-04-25 ENCOUNTER — Encounter: Payer: Self-pay | Admitting: Gastroenterology

## 2014-05-19 IMAGING — CR DG SHOULDER 2+V*L*
4 series · 4 of 4 positions shown · non-contrast
Comparison: None.

CLINICAL DATA: Pain and limited range of motion

EXAM:
LEFT SHOULDER - 3 VIEW

[AP (1 of 3)]
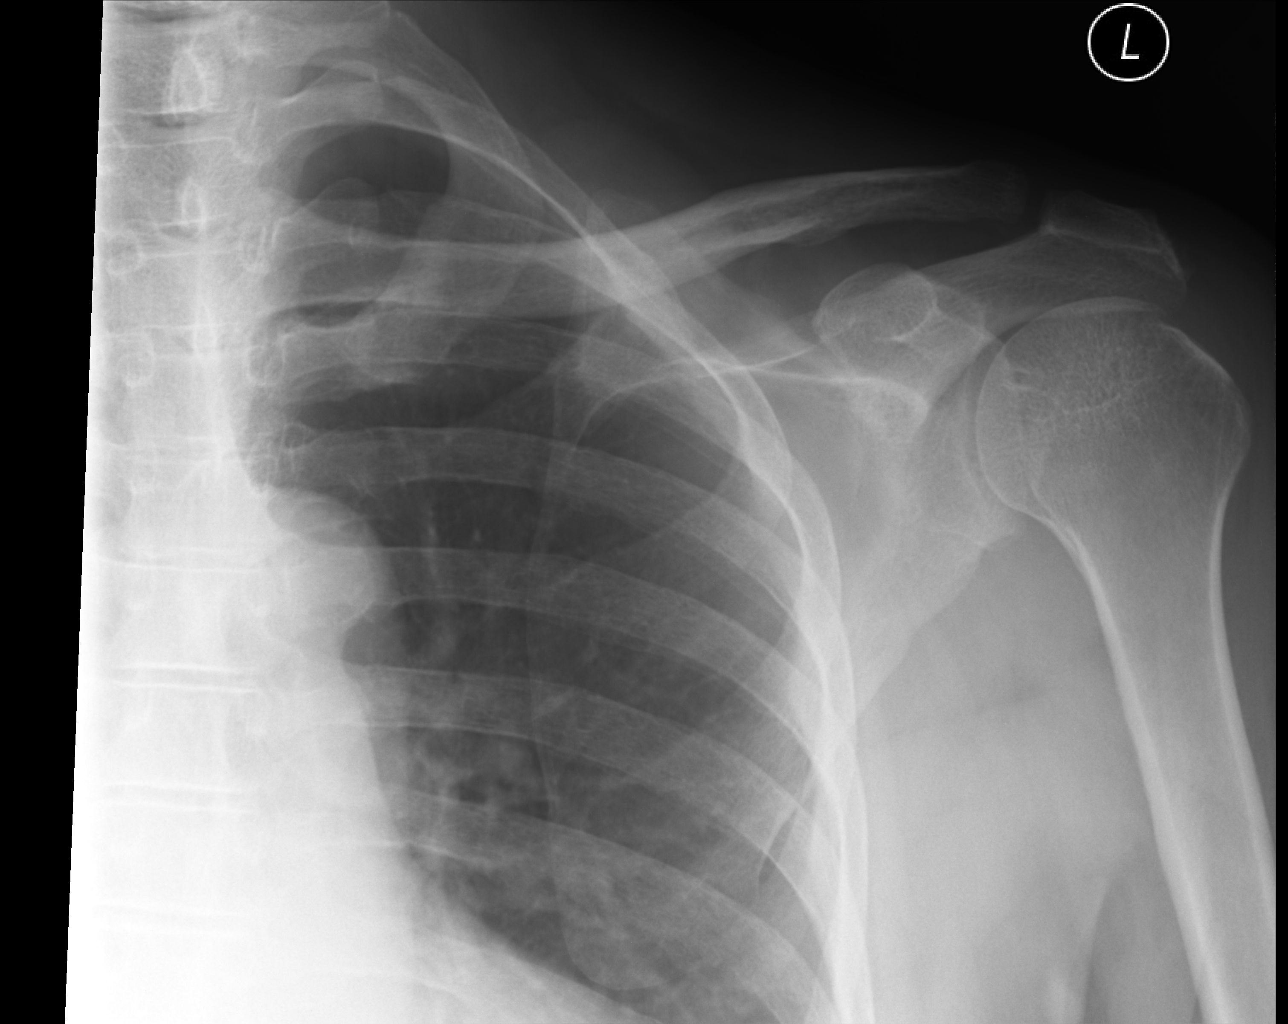

[AP (2 of 3)]
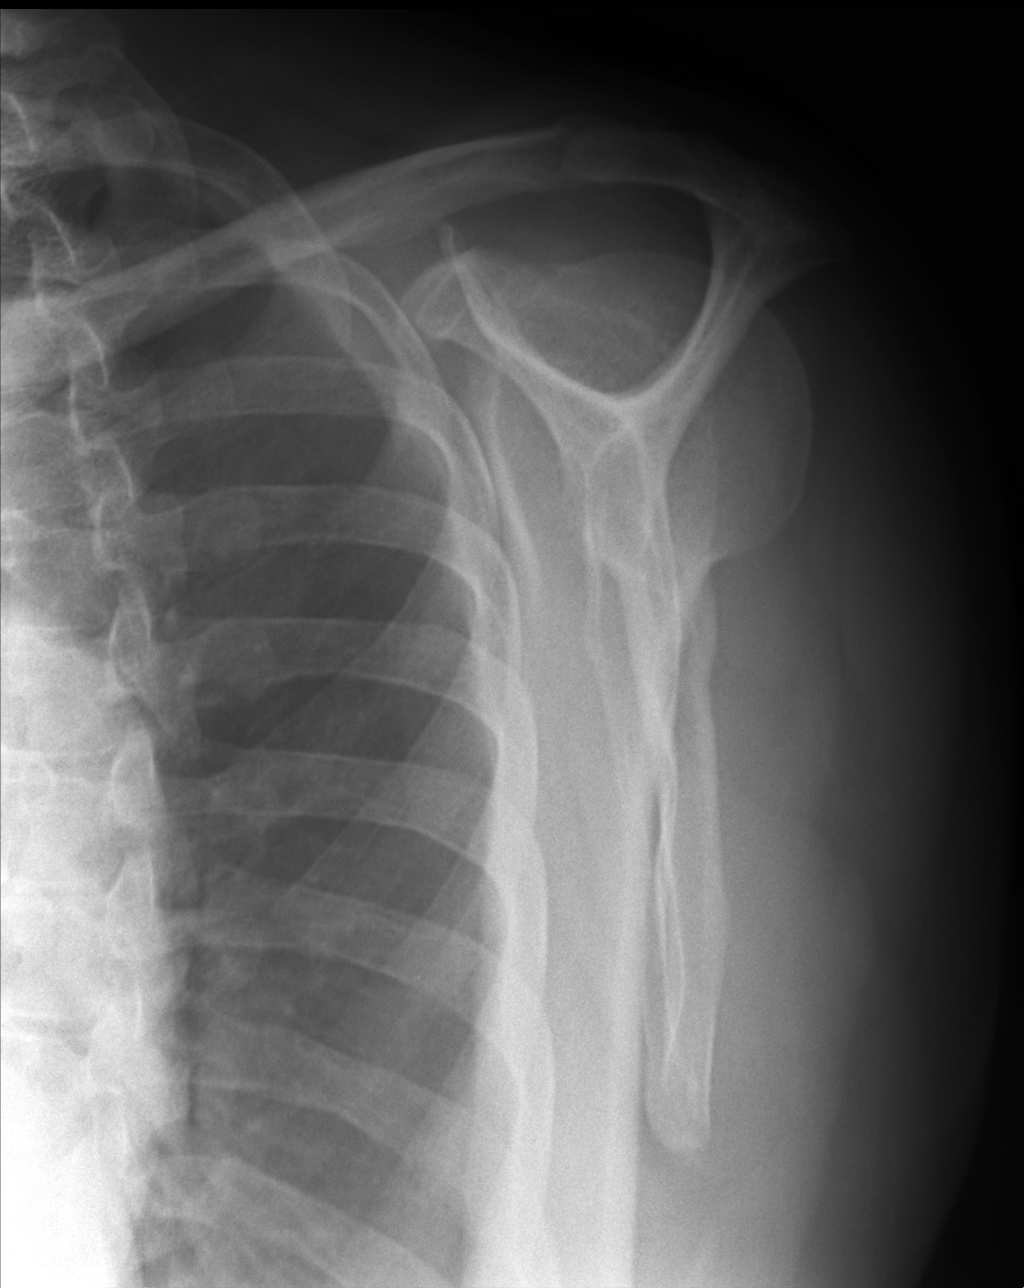

[axial]
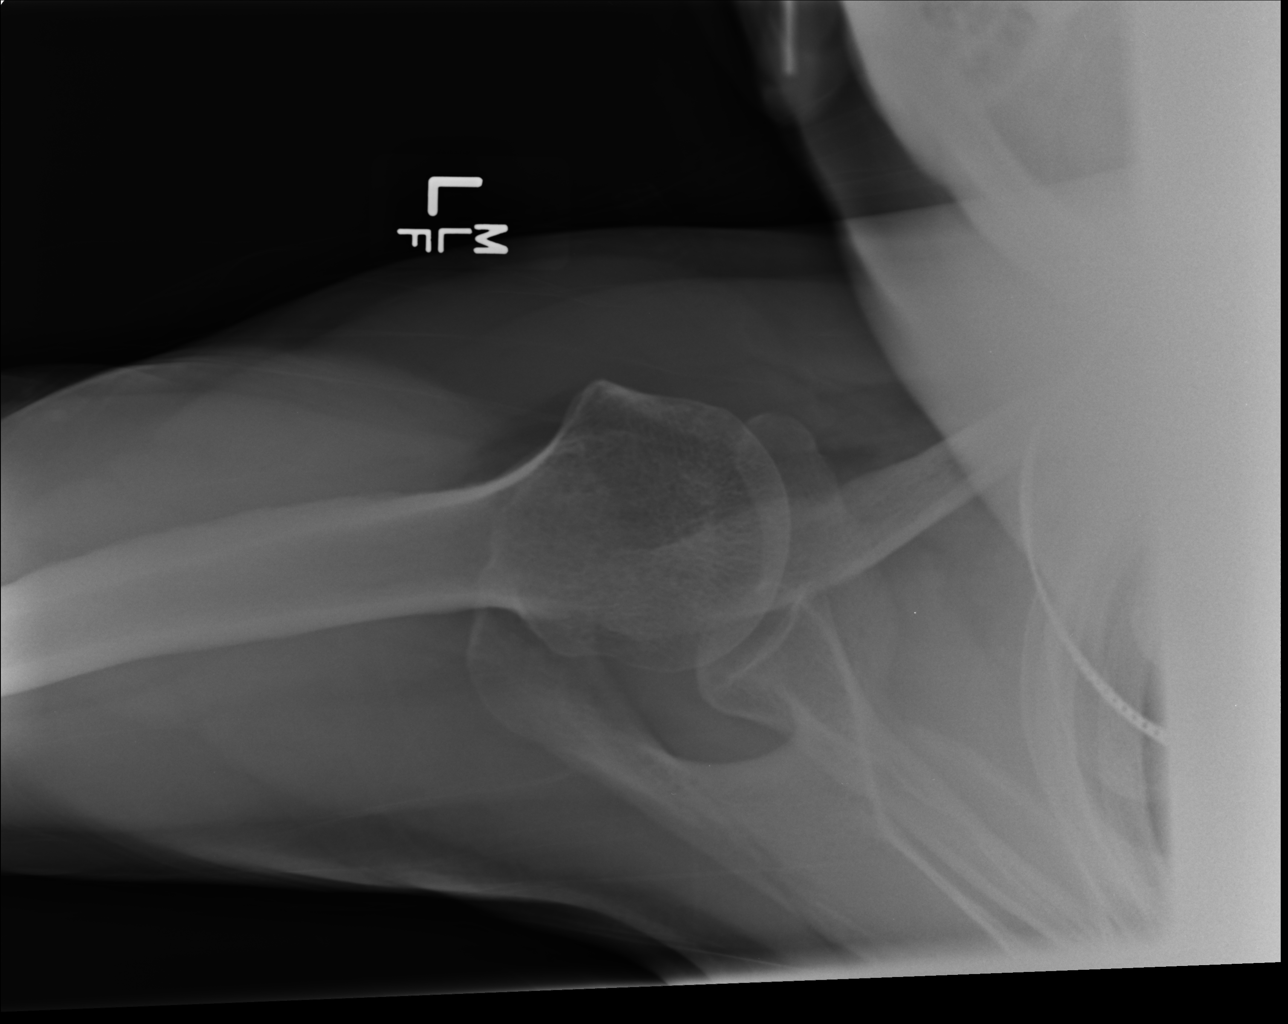

[AP (3 of 3)]
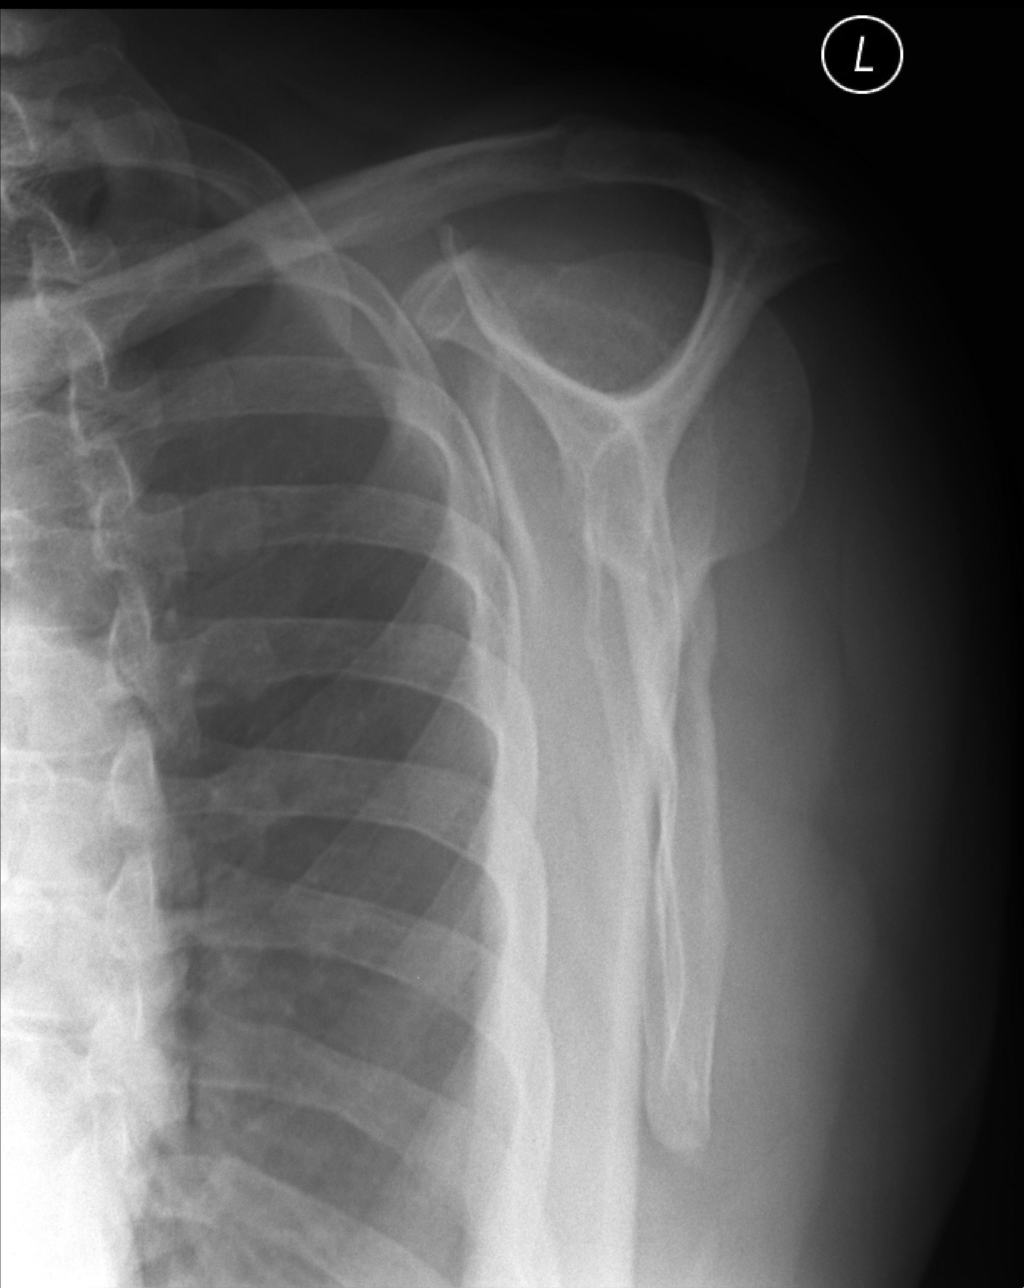

[4 of 4 positions shown; findings below may reference images not displayed]

FINDINGS: The frontal, Y scapular, and axillary views were obtained. There is
no fracture or dislocation. Joint spaces appear intact. There is no
erosive change or intra-articular calcification.
IMPRESSION: No abnormality noted.

## 2014-05-22 DIAGNOSIS — C61 Malignant neoplasm of prostate: Secondary | ICD-10-CM | POA: Diagnosis not present

## 2014-08-28 DIAGNOSIS — C61 Malignant neoplasm of prostate: Secondary | ICD-10-CM | POA: Diagnosis not present

## 2014-09-23 DIAGNOSIS — N359 Urethral stricture, unspecified: Secondary | ICD-10-CM | POA: Diagnosis not present

## 2014-09-23 DIAGNOSIS — N281 Cyst of kidney, acquired: Secondary | ICD-10-CM | POA: Diagnosis not present

## 2014-09-23 DIAGNOSIS — R312 Other microscopic hematuria: Secondary | ICD-10-CM | POA: Diagnosis not present

## 2014-09-30 DIAGNOSIS — C61 Malignant neoplasm of prostate: Secondary | ICD-10-CM | POA: Diagnosis not present

## 2014-12-16 ENCOUNTER — Other Ambulatory Visit: Payer: Self-pay | Admitting: Internal Medicine

## 2015-01-06 DIAGNOSIS — C61 Malignant neoplasm of prostate: Secondary | ICD-10-CM | POA: Diagnosis not present

## 2015-01-12 ENCOUNTER — Other Ambulatory Visit: Payer: Self-pay | Admitting: Internal Medicine

## 2015-01-15 ENCOUNTER — Ambulatory Visit (INDEPENDENT_AMBULATORY_CARE_PROVIDER_SITE_OTHER): Payer: Medicare Other | Admitting: Family Medicine

## 2015-01-15 VITALS — BP 140/80 | HR 62 | Temp 98.2°F | Resp 16 | Ht 71.0 in | Wt 188.4 lb

## 2015-01-15 DIAGNOSIS — Z79899 Other long term (current) drug therapy: Secondary | ICD-10-CM

## 2015-01-15 DIAGNOSIS — I10 Essential (primary) hypertension: Secondary | ICD-10-CM | POA: Diagnosis not present

## 2015-01-15 LAB — BASIC METABOLIC PANEL
BUN: 19 mg/dL (ref 7–25)
CO2: 26 mEq/L (ref 20–31)
Calcium: 9.8 mg/dL (ref 8.6–10.3)
Chloride: 105 mEq/L (ref 98–110)
Creat: 1.23 mg/dL — ABNORMAL HIGH (ref 0.70–1.18)
Potassium: 4.6 mEq/L (ref 3.5–5.3)

## 2015-01-15 LAB — BASIC METABOLIC PANEL WITH GFR
Glucose, Bld: 109 mg/dL — ABNORMAL HIGH (ref 65–99)
Sodium: 143 meq/L (ref 135–146)

## 2015-01-15 MED ORDER — LISINOPRIL 10 MG PO TABS: ORAL_TABLET | ORAL | Status: DC

## 2015-01-15 NOTE — Patient Instructions (Addendum)
Hypertension Hypertension is another name for high blood pressure. High blood pressure forces your heart to work harder to pump blood. A blood pressure reading has two numbers, which includes a higher number over a lower number (example: 110/72). HOME CARE   Have your blood pressure rechecked by your doctor.  Only take medicine as told by your doctor. Follow the directions carefully. The medicine does not work as well if you skip doses. Skipping doses also puts you at risk for problems.  Do not smoke.  Monitor your blood pressure at home as told by your doctor. GET HELP IF:  You think you are having a reaction to the medicine you are taking.  You have repeat headaches or feel dizzy.  You have puffiness (swelling) in your ankles.  You have trouble with your vision. GET HELP RIGHT AWAY IF:   You get a very bad headache and are confused.  You feel weak, numb, or faint.  You get chest or belly (abdominal) pain.  You throw up (vomit).  You cannot breathe very well. MAKE SURE YOU:   Understand these instructions.  Will watch your condition.  Will get help right away if you are not doing well or get worse. Document Released: 11/23/2007 Document Revised: 06/11/2013 Document Reviewed: 03/29/2013 [facility] Patient Information 2015 [place], [place]. This information is not intended to replace advice given to you by your health care provider. Make sure you discuss any questions you have with your health care provider.  DASH Eating Plan DASH stands for "Dietary Approaches to Stop Hypertension." The DASH eating plan is a healthy eating plan that has been shown to reduce high blood pressure (hypertension). Additional health benefits may include reducing the risk of type 2 diabetes mellitus, heart disease, and stroke. The DASH eating plan may also help with weight loss. WHAT DO I NEED TO KNOW ABOUT THE DASH EATING PLAN? For the DASH eating plan, you will follow these general  guidelines:  Choose foods with a percent daily value for sodium of less than 5% (as listed on the food label).  Use salt-free seasonings or herbs instead of table salt or sea salt.  Check with your health care provider or pharmacist before using salt substitutes.  Eat lower-sodium products, often labeled as "lower sodium" or "no salt added."  Eat fresh foods.  Eat more vegetables, fruits, and low-fat dairy products.  Choose whole grains. Look for the word "whole" as the first word in the ingredient list.  Choose fish and skinless chicken or [place] more often than red meat. Limit fish, poultry, and meat to 6 oz (170 g) each day.  Limit sweets, desserts, sugars, and sugary drinks.  Choose heart-healthy fats.  Limit cheese to 1 oz (28 g) per day.  Eat more home-cooked food and less restaurant, buffet, and fast food.  Limit fried foods.  Cook foods using methods other than frying.  Limit canned vegetables. If you do use them, rinse them well to decrease the sodium.  When eating at a restaurant, ask that your food be prepared with less salt, or no salt if possible. WHAT FOODS CAN I EAT? Seek help from a dietitian for individual calorie needs. Grains Whole grain or whole wheat bread. Brown rice. Whole grain or whole wheat pasta. Quinoa, bulgur, and whole grain cereals. Low-sodium cereals. Corn or whole wheat flour tortillas. Whole grain cornbread. Whole grain crackers. Low-sodium crackers. Vegetables Fresh or frozen vegetables (raw, steamed, roasted, or grilled). Low-sodium or reduced-sodium tomato and vegetable juices. Low-sodium  or reduced-sodium tomato sauce and paste. Low-sodium or reduced-sodium canned vegetables.  Fruits All fresh, canned (in natural juice), or frozen fruits. Meat and Other Protein Products Ground beef (85% or leaner), grass-fed beef, or beef trimmed of fat. Skinless chicken or [place]. Ground chicken or [place]. Pork trimmed of fat. All fish and seafood.  Eggs. Dried beans, peas, or lentils. Unsalted nuts and seeds. Unsalted canned beans. Dairy Low-fat dairy products, such as skim or 1% milk, 2% or reduced-fat cheeses, low-fat ricotta or cottage cheese, or plain low-fat yogurt. Low-sodium or reduced-sodium cheeses. Fats and Oils Tub margarines without trans fats. Light or reduced-fat mayonnaise and salad dressings (reduced sodium). Avocado. Safflower, olive, or canola oils. Natural peanut or almond butter. Other Unsalted popcorn and pretzels. The items listed above may not be a complete list of recommended foods or beverages. Contact your dietitian for more options. WHAT FOODS ARE NOT RECOMMENDED? Grains White bread. White pasta. White rice. Refined cornbread. Bagels and croissants. Crackers that contain trans fat. Vegetables Creamed or fried vegetables. Vegetables in a cheese sauce. Regular canned vegetables. Regular canned tomato sauce and paste. Regular tomato and vegetable juices. Fruits Dried fruits. Canned fruit in light or heavy syrup. Fruit juice. Meat and Other Protein Products Fatty cuts of meat. Ribs, chicken wings, bacon, sausage, bologna, salami, chitterlings, fatback, hot dogs, bratwurst, and packaged luncheon meats. Salted nuts and seeds. Canned beans with salt. Dairy Whole or 2% milk, cream, half-and-half, and cream cheese. Whole-fat or sweetened yogurt. Full-fat cheeses or blue cheese. Nondairy creamers and whipped toppings. Processed cheese, cheese spreads, or cheese curds. Condiments Onion and garlic salt, seasoned salt, table salt, and sea salt. Canned and packaged gravies. Worcestershire sauce. Tartar sauce. Barbecue sauce. Teriyaki sauce. Soy sauce, including reduced sodium. Steak sauce. Fish sauce. Oyster sauce. Cocktail sauce. Horseradish. Ketchup and mustard. Meat flavorings and tenderizers. Bouillon cubes. Hot sauce. Tabasco sauce. Marinades. Taco seasonings. Relishes. Fats and Oils Butter, stick margarine, lard,  shortening, ghee, and bacon fat. Coconut, palm kernel, or palm oils. Regular salad dressings. Other Pickles and olives. Salted popcorn and pretzels. The items listed above may not be a complete list of foods and beverages to avoid. Contact your dietitian for more information. WHERE CAN I FIND MORE INFORMATION? National Heart, Lung, and Blood Institute: travelstabloid.com Document Released: 05/26/2011 Document Revised: 10/21/2013 Document Reviewed: 04/10/2013 [facility] Patient Information 2015 [place], [place]. This information is not intended to replace advice given to you by your health care provider. Make sure you discuss any questions you have with your health care provider.

## 2015-01-15 NOTE — Progress Notes (Signed)
Chief Complaint:  Chief Complaint  Patient presents with  . Medication Refill    HPI: David Rios is a 69 y.o. male who reports to [facility] today complaining of additional medication refills. He is doing well. He denies any dizziness chest pain or shortness of breath palpitations. His blood pressure when he takes it at the urologist is less than 140/90. He says that he sees urologist regular for his prostate cancer checkup and it is about every 3 months Dr. Imelda Pillow in [facility]. He is doing well.  Past Medical History  Diagnosis Date  . Cancer   . Hypertension    Past Surgical History  Procedure Laterality Date  . Prostate surgery     History   Social History  . Marital Status: Married    Spouse Name: N/A  . Number of Children: N/A  . Years of Education: N/A   Social History Main Topics  . Smoking status: Never Smoker   . Smokeless tobacco: Never Used  . Alcohol Use: No  . Drug Use: Not on file  . Sexual Activity: Yes   Other Topics Concern  . None   Social History Narrative   Family History  Problem Relation Age of Onset  . Hypertension Mother   . Hypertension Father   . Hypertension Brother   . Hypertension Brother   . Hypertension Brother    No Known Allergies Prior to Admission medications   Medication Sig Start Date End Date Taking? Authorizing Provider  aspirin 81 MG tablet Take 81 mg by mouth daily.   Yes Historical Provider, MD  bicalutamide (CASODEX) 50 MG tablet  08/22/11  Yes Historical Provider, MD  lisinopril (PRINIVIL,ZESTRIL) 10 MG tablet TAKE 1 TABLET BY MOUTH EVERY DAY. "OV NEEDED" 01/14/15  Yes Chelle Jeffery, PA-C  Multiple Vitamin (MULTIVITAMIN) capsule Take 1 capsule by mouth daily.   Yes Historical Provider, MD  celecoxib (CELEBREX) 200 MG capsule Take 1 capsule (200 mg total) by mouth 2 (two) times daily. Patient not taking: Reported on 01/15/2015 03/08/13   Orma Flaming, MD     ROS: The patient denies fevers, chills, night sweats,  unintentional weight loss, chest pain, palpitations, wheezing, dyspnea on exertion, nausea, vomiting, abdominal pain, dysuria, hematuria, melena, numbness, weakness, or tingling.   All other systems have been reviewed and were otherwise negative with the exception of those mentioned in the HPI and as above.    PHYSICAL EXAM: Filed Vitals:   01/15/15 0916  BP: 140/80  Pulse: 62  Temp: 98.2 F (36.8 C)  Resp: 16   Body mass index is 26.29 kg/(m^2).   General: Alert, no acute distress HEENT:  Normocephalic, atraumatic, oropharynx patent. EOMI, PERRLA Cardiovascular:  Regular rate and rhythm, no rubs murmurs or gallops.  No Carotid bruits, radial pulse intact. No pedal edema.  Respiratory: Clear to auscultation bilaterally.  No wheezes, rales, or rhonchi.  No cyanosis, no use of accessory musculature Abdominal: No organomegaly, abdomen is soft and non-tender, positive bowel sounds. No masses. Skin: No rashes. Neurologic: Facial musculature symmetric. Psychiatric: Patient acts appropriately throughout our interaction. Lymphatic: No cervical or submandibular lymphadenopathy Musculoskeletal: Gait intact. No edema, tenderness   LABS: Results for orders placed or performed in visit on 02/01/47  Basic metabolic panel  Result Value Ref Range   Sodium 142 135 - 145 mEq/L   Potassium 4.7 3.5 - 5.3 mEq/L   Chloride 104 96 - 112 mEq/L   CO2 29 19 - 32 mEq/L  Glucose, Bld 103 (H) 70 - 99 mg/dL   BUN 16 6 - 23 mg/dL   Creat 1.21 0.50 - 1.35 mg/dL   Calcium 10.1 8.4 - 10.5 mg/dL     EKG/XRAY:   Primary read interpreted by Dr. Marin Comment at [facility].   ASSESSMENT/PLAN: Encounter Diagnoses  Name Primary?  . Encounter for medication review   . Essential hypertension Yes   Doing well with his hypertension. Everything is stable.  Refill medication for 1 year.  BMP pending. Follow-Up in 6 months to one year.  Gross sideeffects, risk and benefits, and alternatives of medications d/w patient.  Patient is aware that all medications have potential sideeffects and we are unable to predict every sideeffect or drug-drug interaction that may occur.  Thao Le DO  01/15/2015 9:51 AM

## 2015-01-22 ENCOUNTER — Encounter: Payer: Self-pay | Admitting: Family Medicine

## 2015-02-06 DIAGNOSIS — C61 Malignant neoplasm of prostate: Secondary | ICD-10-CM | POA: Diagnosis not present

## 2015-04-17 ENCOUNTER — Telehealth: Payer: Self-pay | Admitting: Family Medicine

## 2015-04-17 NOTE — Telephone Encounter (Signed)
LEFT A MESSAGE FOR PATIENT TO SCHEDULE APPOINTMENT TO CLOSE GAPS IN CARE.  NEEDS FALL SCREENING, HTN , AND FLU VACCINE.

## 2015-05-08 DIAGNOSIS — R3129 Other microscopic hematuria: Secondary | ICD-10-CM | POA: Diagnosis not present

## 2015-05-08 DIAGNOSIS — C61 Malignant neoplasm of prostate: Secondary | ICD-10-CM | POA: Diagnosis not present

## 2015-05-26 DIAGNOSIS — C61 Malignant neoplasm of prostate: Secondary | ICD-10-CM | POA: Diagnosis not present

## 2015-05-26 DIAGNOSIS — N289 Disorder of kidney and ureter, unspecified: Secondary | ICD-10-CM | POA: Diagnosis not present

## 2015-05-26 DIAGNOSIS — R200 Anesthesia of skin: Secondary | ICD-10-CM | POA: Diagnosis not present

## 2015-05-26 DIAGNOSIS — N359 Urethral stricture, unspecified: Secondary | ICD-10-CM | POA: Diagnosis not present

## 2015-05-26 DIAGNOSIS — R3129 Other microscopic hematuria: Secondary | ICD-10-CM | POA: Diagnosis not present

## 2015-08-20 DIAGNOSIS — C61 Malignant neoplasm of prostate: Secondary | ICD-10-CM | POA: Diagnosis not present

## 2015-10-13 DIAGNOSIS — C61 Malignant neoplasm of prostate: Secondary | ICD-10-CM | POA: Diagnosis not present

## 2016-02-14 ENCOUNTER — Other Ambulatory Visit: Payer: Self-pay | Admitting: Family Medicine

## 2016-02-18 DIAGNOSIS — C61 Malignant neoplasm of prostate: Secondary | ICD-10-CM | POA: Diagnosis not present

## 2016-03-10 ENCOUNTER — Other Ambulatory Visit: Payer: Self-pay | Admitting: Family Medicine

## 2016-03-21 ENCOUNTER — Ambulatory Visit: Payer: Medicare Other

## 2016-03-24 ENCOUNTER — Ambulatory Visit (INDEPENDENT_AMBULATORY_CARE_PROVIDER_SITE_OTHER): Payer: Medicare Other | Admitting: Family Medicine

## 2016-03-24 VITALS — BP 144/82 | HR 89 | Temp 98.5°F | Resp 18 | Ht 71.0 in | Wt 190.0 lb

## 2016-03-24 DIAGNOSIS — I10 Essential (primary) hypertension: Secondary | ICD-10-CM

## 2016-03-24 MED ORDER — LISINOPRIL 10 MG PO TABS: ORAL_TABLET | ORAL | 3 refills | Status: DC

## 2016-03-24 NOTE — Progress Notes (Signed)
Patient ID: David Rios, male    DOB: 10/11/1945  Age: 70 y.o. MRN: [id number]  Chief Complaint  Patient presents with  . Medication Refill    Lisinopril    Subjective:  70 year old gentleman who is here for refill of his blood pressure medicine. He used to see Dr.  Elder Cyphers. He has been doing quite well. He is retired but stays busy, helps care for his siblings as well as for his grandchildren. He needs to reestablish with another doctor sometime soon, being overdue for a physical. He would like to get that point get scheduled for a colonoscopy which is due next week. He does see a urologist who monitors his prostate which he has had surgery on in the past.  Current allergies, medications, problem list, past/family and social histories reviewed.  Objective:  BP (!) 144/82 (BP Location: Right Arm, Patient Position: Sitting, Cuff Size: Small)   Pulse 89   Temp 98.5 F (36.9 C) (Oral)   Resp 18   Ht 5\' 11"  (1.803 m)   Wt 190 lb (86.2 kg)   SpO2 96%   BMI 26.50 kg/m   Chest clear. Heart regular without murmurs. No carotid bruits. Elevation of blood pressure as noted today but he is been off of his medicine for several days since he ran out.  Assessment & Plan:   Assessment: 1. Essential hypertension       Plan: Refill his medications. Have him get set up for a physical in the reasonable future. He will get his labs and referrals done at that time.  No orders of the defined types were placed in this encounter.   No orders of the defined types were placed in this encounter.        Patient Instructions   Continue taking your blood pressure medication faithfully  Speak to the people at the front desk about scheduling yourself for a physical examination with one of the providers who will be here.  I encourage regular exercise daily  We will get his flu shot elsewhere.  When you have your physical examination, make sure you get scheduled for a colonoscopy which is  due in 2018.    IF you received an x-ray today, you will receive an invoice from [facility]. Please contact [facility] at [phone] with questions or concerns regarding your invoice.   IF you received labwork today, you will receive an invoice from [employer]. Please contact [facility] at [phone] with questions or concerns regarding your invoice.   Our billing staff will not be able to assist you with questions regarding bills from these companies.  You will be contacted with the lab results as soon as they are available. The fastest way to get your results is to activate your My Chart account. Instructions are located on the last page of this paperwork. If you have not heard from [place] regarding the results in 2 weeks, please contact this office.        No Follow-up on file.   Antuan Limes, MD 03/24/2016

## 2016-03-24 NOTE — Patient Instructions (Addendum)
Continue taking your blood pressure medication faithfully  Speak to the people at the front desk about scheduling yourself for a physical examination with one of the providers who will be here.  I encourage regular exercise daily  We will get his flu shot elsewhere.  When you have your physical examination, make sure you get scheduled for a colonoscopy which is due in 2018.    IF you received an x-ray today, you will receive an invoice from [facility]. Please contact [facility] at [phone] with questions or concerns regarding your invoice.   IF you received labwork today, you will receive an invoice from [employer]. Please contact [facility] at [phone] with questions or concerns regarding your invoice.   Our billing staff will not be able to assist you with questions regarding bills from these companies.  You will be contacted with the lab results as soon as they are available. The fastest way to get your results is to activate your My Chart account. Instructions are located on the last page of this paperwork. If you have not heard from [place] regarding the results in 2 weeks, please contact this office.

## 2016-04-07 DIAGNOSIS — C61 Malignant neoplasm of prostate: Secondary | ICD-10-CM | POA: Diagnosis not present

## 2016-12-08 DIAGNOSIS — C61 Malignant neoplasm of prostate: Secondary | ICD-10-CM | POA: Diagnosis not present

## 2017-04-23 ENCOUNTER — Other Ambulatory Visit: Payer: Self-pay | Admitting: Family Medicine

## 2017-04-23 DIAGNOSIS — I10 Essential (primary) hypertension: Secondary | ICD-10-CM

## 2017-06-30 DIAGNOSIS — I10 Essential (primary) hypertension: Secondary | ICD-10-CM | POA: Diagnosis not present

## 2017-06-30 DIAGNOSIS — Z8546 Personal history of malignant neoplasm of prostate: Secondary | ICD-10-CM | POA: Diagnosis not present

## 2017-07-27 DIAGNOSIS — H52209 Unspecified astigmatism, unspecified eye: Secondary | ICD-10-CM | POA: Diagnosis not present

## 2017-07-27 DIAGNOSIS — H524 Presbyopia: Secondary | ICD-10-CM | POA: Diagnosis not present

## 2017-07-27 DIAGNOSIS — C61 Malignant neoplasm of prostate: Secondary | ICD-10-CM | POA: Diagnosis not present

## 2017-07-27 DIAGNOSIS — H5203 Hypermetropia, bilateral: Secondary | ICD-10-CM | POA: Diagnosis not present

## 2017-08-07 DIAGNOSIS — C61 Malignant neoplasm of prostate: Secondary | ICD-10-CM | POA: Diagnosis not present

## 2017-11-06 DIAGNOSIS — C61 Malignant neoplasm of prostate: Secondary | ICD-10-CM | POA: Diagnosis not present

## 2017-12-11 DIAGNOSIS — C61 Malignant neoplasm of prostate: Secondary | ICD-10-CM | POA: Diagnosis not present

## 2018-01-09 DIAGNOSIS — C61 Malignant neoplasm of prostate: Secondary | ICD-10-CM | POA: Diagnosis not present

## 2018-01-09 DIAGNOSIS — I10 Essential (primary) hypertension: Secondary | ICD-10-CM | POA: Diagnosis not present

## 2018-01-09 DIAGNOSIS — Z1389 Encounter for screening for other disorder: Secondary | ICD-10-CM | POA: Diagnosis not present

## 2018-01-09 DIAGNOSIS — Z0000 Encounter for general adult medical examination without abnormal findings: Secondary | ICD-10-CM | POA: Diagnosis not present

## 2018-01-09 DIAGNOSIS — Z23 Encounter for immunization: Secondary | ICD-10-CM | POA: Diagnosis not present

## 2018-06-16 DIAGNOSIS — J069 Acute upper respiratory infection, unspecified: Secondary | ICD-10-CM | POA: Diagnosis not present

## 2018-07-06 ENCOUNTER — Ambulatory Visit: Payer: Self-pay | Admitting: Cardiology

## 2018-07-12 ENCOUNTER — Ambulatory Visit: Payer: Medicare Other | Admitting: Cardiology

## 2018-07-12 ENCOUNTER — Encounter: Payer: Self-pay | Admitting: Cardiology

## 2018-07-12 VITALS — BP 138/62 | HR 45 | Ht 71.0 in | Wt 173.0 lb

## 2018-07-12 DIAGNOSIS — I10 Essential (primary) hypertension: Secondary | ICD-10-CM | POA: Diagnosis not present

## 2018-07-12 DIAGNOSIS — I441 Atrioventricular block, second degree: Secondary | ICD-10-CM

## 2018-07-12 DIAGNOSIS — R0789 Other chest pain: Secondary | ICD-10-CM

## 2018-07-12 HISTORY — DX: Atrioventricular block, second degree: I44.1

## 2018-07-12 HISTORY — DX: Other chest pain: R07.89

## 2018-07-12 NOTE — Progress Notes (Signed)
Cardiology Office Note:    Date:  07/12/2018   ID:  David Rios, DOB Oct 16, 1945, MRN [id number]  PCP:  Seward Carol, MD  Cardiologist:  Jenean Lindau, MD   Referring MD: Seward Carol, MD    ASSESSMENT:    1. AV block, 2nd degree   2. Essential hypertension    PLAN:    In order of problems listed above:  1. Primary prevention stressed with the patient.  Importance of compliance with diet and medication stressed and he vocalized understanding.  His blood pressure is stable he is asymptomatic and has excellent effort tolerance. 2. I will obtain blood work from primary care office and the reports especially TSH in view of the bradycardia. 3. His EKGs are suggestive of narrow complex QRS with 2-1 conduction.  I will obtain a stress echo.  This will help me understand the cardiac anatomy and also give me an assessment of chronotropic competence. 4. He will undergo 48-hour Holter monitoring to evaluate his AV conduction further at this time is completely asymptomatic with great effort tolerance so I do not see any reason for any intervention 5. Patient will be seen in follow-up appointment in 6 months or earlier if the patient has any concerns    Medication Adjustments/Labs and Tests Ordered: Current medicines are reviewed at length with the patient today.  Concerns regarding medicines are outlined above.  No orders of the defined types were placed in this encounter.  No orders of the defined types were placed in this encounter.    History of Present Illness:    David Rios is a 73 y.o. male who is being seen today for the evaluation of bradycardia arrhythmia at the request of Seward Carol, MD.  Patient is a pleasant 73 year old male.  He has past medical history of essential hypertension.  He is a very active gentleman and walks on a regular basis.  He does part-time work even at this time.  He tells me about 3 times a week he walks about an hour and a half on a regular  basis without any symptoms.  He denies any history of chest pain orthopnea or PND.  He denies any history of palpitations or dizziness or syncope.  He says occasionally he will have some chest discomfort which is not related to exertion and does not radiate.  At the time of my evaluation, the patient is alert awake oriented and in no distress.  Past Medical History:  Diagnosis Date  . Cancer ([place])   . Hypertension     Past Surgical History:  Procedure Laterality Date  . PROSTATE SURGERY      Current Medications: Current Meds  Medication Sig  . aspirin 81 MG tablet Take 81 mg by mouth daily.  Marland Kitchen lisinopril (PRINIVIL,ZESTRIL) 10 MG tablet TAKE 1 TABLET BY MOUTH DAILY  Office visit needed for 90 day supply  . Multiple Vitamin (MULTIVITAMIN) capsule Take 1 capsule by mouth daily.     Allergies:   Patient has no known allergies.   Social History   Socioeconomic History  . Marital status: Married    Spouse name: Not on file  . Number of children: Not on file  . Years of education: Not on file  . Highest education level: Not on file  Occupational History  . Not on file  Social Needs  . Financial resource strain: Not on file  . Food insecurity:    Worry: Not on file    Inability: Not  on file  . Transportation needs:    Medical: Not on file    Non-medical: Not on file  Tobacco Use  . Smoking status: Never Smoker  . Smokeless tobacco: Never Used  Substance and Sexual Activity  . Alcohol use: No  . Drug use: Not on file  . Sexual activity: Yes  Lifestyle  . Physical activity:    Days per week: Not on file    Minutes per session: Not on file  . Stress: Not on file  Relationships  . Social connections:    Talks on phone: Not on file    Gets together: Not on file    Attends religious service: Not on file    Active member of club or organization: Not on file    Attends meetings of clubs or organizations: Not on file    Relationship status: Not on file  Other Topics  Concern  . Not on file  Social History Narrative  . Not on file     Family History: The patient's family history includes Hypertension in his brother, brother, brother, father, and mother.  ROS:   Please see the history of present illness.    All other systems reviewed and are negative.  EKGs/Labs/Other Studies Reviewed:    The following studies were reviewed today: I discussed my findings with the patient at length.  EKG is suggestive of narrow QRS with 2-1 conduction.   Recent Labs: No results found for requested labs within last 8760 hours.  Recent Lipid Panel    Component Value Date/Time   CHOL 190 07/16/2012 1118   TRIG 60 07/16/2012 1118   HDL 46 07/16/2012 1118   CHOLHDL 4.1 07/16/2012 1118   VLDL 12 07/16/2012 1118   LDLCALC 132 (H) 07/16/2012 1118    Physical Exam:    VS:  BP 138/62 (BP Location: Right Arm, Patient Position: Sitting, Cuff Size: Normal)   Pulse (!) 45   Ht 5\' 11"  (1.803 m)   Wt 173 lb (78.5 kg)   SpO2 99%   BMI 24.13 kg/m     Wt Readings from Last 3 Encounters:  07/12/18 173 lb (78.5 kg)  03/24/16 190 lb (86.2 kg)  01/15/15 188 lb 6.4 oz (85.5 kg)     GEN: Patient is in no acute distress HEENT: Normal NECK: No JVD; No carotid bruits LYMPHATICS: No lymphadenopathy CARDIAC: S1 S2 regular, 2/6 systolic murmur at the apex. RESPIRATORY:  Clear to auscultation without rales, wheezing or rhonchi  ABDOMEN: Soft, non-tender, non-distended MUSCULOSKELETAL:  No edema; No deformity  SKIN: Warm and dry NEUROLOGIC:  Alert and oriented x 3 PSYCHIATRIC:  Normal affect    Signed, Jenean Lindau, MD  07/12/2018 3:22 PM     [facility]

## 2018-07-12 NOTE — Patient Instructions (Signed)
Medication Instructions:  Your physician recommends that you continue on your current medications as directed. Please refer to the Current Medication list given to you today.  If you need a refill on your cardiac medications before your next appointment, please call your pharmacy.   Lab work: None.  If you have labs (blood work) drawn today and your tests are completely normal, you will receive your results only by: Marland Kitchen MyChart Message (if you have MyChart) OR . A paper copy in the mail If you have any lab test that is abnormal or we need to change your treatment, we will call you to review the results.  Testing/Procedures: Your physician has requested that you have a stress echocardiogram. For further information please visit [email or web address]. Please follow instruction sheet as given.  Your physician has recommended that you wear a holter monitor. Holter monitors are medical devices that record the heart's electrical activity. Doctors most often use these monitors to diagnose arrhythmias. Arrhythmias are problems with the speed or rhythm of the heartbeat. The monitor is a small, portable device. You can wear one while you do your normal daily activities. This is usually used to diagnose what is causing palpitations/syncope (passing out). Wear for 48 hours     Follow-Up: At [facility], you and your health needs are our priority.  As part of our continuing mission to provide you with exceptional heart care, we have created designated Provider Care Teams.  These Care Teams include your primary Cardiologist (physician) and Advanced Practice Providers (APPs -  Physician Assistants and Nurse Practitioners) who all work together to provide you with the care you need, when you need it. You will need a follow up appointment in 6 months.  Please call our office 2 months in advance to schedule this appointment.  You may see No primary care provider on file. or another member of our [facility] in [place]: Jenne Campus, MD . Shirlee More, MD  Any Other Special Instructions Will Be Listed Below (If Applicable).   Exercise Stress Echocardiogram  An exercise stress echocardiogram is a test to check how well your heart is working. This test uses sound waves (ultrasound) and a computer to make images of your heart before and after exercise. Ultrasound images that are taken before you exercise (your resting echocardiogram) will show how much blood is getting to your heart muscle and how well your heart muscle and heart valves are functioning. During the next part of this test, you will walk on a treadmill or ride a stationary bike to see how exercise affects your heart. While you exercise, the electrical activity of your heart will be monitored with an electrocardiogram (ECG). Your blood pressure will also be monitored. You may have this test if you:  Have chest pain or other symptoms of a heart problem.  Recently had a heart attack or heart surgery.  Have heart valve problems.  Have a condition that causes narrowing of the blood vessels that supply your heart (coronary artery disease).  Have a high risk of heart disease and are starting a new exercise program.  Have a high risk of heart disease and need to have major surgery. Tell a health care provider about:  Any allergies you have.  All medicines you are taking, including vitamins, herbs, eye drops, creams, and over-the-counter medicines.  Any problems you or family members have had with anesthetic medicines.  Any blood disorders you have.  Any surgeries you have had.  Any  medical conditions you have.  Whether you are pregnant or may be pregnant. What are the risks? Generally, this is a safe procedure. However, problems may occur, including:  Chest pain.  Dizziness or light-headedness.  Shortness of breath.  Increased or irregular heartbeat (palpitations).  Nausea or vomiting.  Heart  attack (very rare). What happens before the procedure?  Follow instructions from your health care provider about eating or drinking restrictions. You may be asked to avoid all forms of caffeine for 24 hours before your procedure, or as told by your health care provider.  Ask your health care provider about changing or stopping your regular medicines. This is especially important if you are taking diabetes medicines or blood thinners.  If you use an inhaler, bring it with you to the test.  Wear loose, comfortable clothing and walking shoes.  Do notuse any products that contain nicotine or tobacco, such as cigarettes and e-cigarettes, for 4 hours before the test or as told by your health care provider. If you need help quitting, ask your health care provider. What happens during the procedure?  You will take off your clothes from the waist up and put on a hospital gown.  A technician will place electrodes on your chest.  A blood pressure cuff will be placed on your arm.  You will lie down on a table for an ultrasound exam before you exercise. Gel will be rubbed on your chest, and a handheld device (transducer) will be pressed against your chest and moved over your heart.  Then, you will start exercising by walking on a treadmill or pedaling a stationary bicycle.  Your blood pressure and heart rhythm will be monitored while you exercise.  The exercise will gradually get harder or faster.  You will exercise until: ? Your heart reaches a target level. ? You are too tired to continue. ? You cannot continue because of chest pain, weakness, or dizziness.  You will have another ultrasound exam after you stop exercising. The procedure may vary among health care providers and hospitals. What happens after the procedure?  Your heart rate and blood pressure will be monitored until they return to your normal levels. Summary  An exercise stress echocardiogram is a test that uses ultrasound  to check how well your heart works before and after exercise.  Before the test, follow instructions from your health care provider about stopping medications, avoiding nicotine and tobacco, and avoiding certain foods and drinks.  During the test, your blood pressure and heart rhythm will be monitored while you exercise on a treadmill or stationary bicycle. This information is not intended to replace advice given to you by your health care provider. Make sure you discuss any questions you have with your health care provider. Document Released: 06/10/2004 Document Revised: 01/27/2016 Document Reviewed: 01/27/2016 Elsevier Interactive Patient Education  2019 [employer].    Ambulatory Cardiac Monitoring An ambulatory cardiac monitor is a small recording device that is used to detect abnormal heart rhythms (arrhythmias). Most monitors are connected by wires to flat, sticky disks (electrodes) that are then attached to your chest. You may need to wear a monitor if you have had symptoms such as:  Fast heartbeats (palpitations).  Dizziness.  Fainting or light-headedness.  Unexplained weakness.  Shortness of breath. There are several types of monitors. Some common monitors include:  Holter monitor. This records your heart rhythm continuously, usually for 24-48 hours.  Event (episodic) monitor. This monitor has a symptoms button, and when pushed, it  will begin recording. You need to activate this monitor to record when you have a heart-related symptom.  Automatic detection monitor. This monitor will begin recording when it detects an abnormal heartbeat. What are the risks? Generally, these devices are safe to use. However, it is possible that the skin under the electrodes will become irritated. How to prepare for monitoring Your health care provider will prepare your chest for the electrode placement and show you how to use the monitor.  Do not apply lotions to your chest before  monitoring.  Follow directions on how to care for the monitor, and how to return the monitor when the testing period is complete. How to use your cardiac monitor  Follow directions about how long to wear the monitor, and if you can take the monitor off in order to shower or bathe. ? Do not let the monitor get wet. ? Do not bathe, swim, or use a hot tub while wearing the monitor.  Keep your skin clean. Do not put body lotion or moisturizer on your chest.  Change the electrodes as told by your health care provider, or any time they stop sticking to your skin. You may need to use medical tape to keep them on.  Try to put the electrodes in slightly different places on your chest to help prevent skin irritation. Follow directions from your health care provider about where to place the electrodes.  Make sure the monitor is safely clipped to your clothing or in a location close to your body as recommended by your health care provider.  If your monitor has a symptoms button, press the button to mark an event as soon as you feel a heart-related symptom, such as: ? Dizziness. ? Weakness. ? Light-headedness. ? Palpitations. ? Thumping or pounding in your chest. ? Shortness of breath. ? Unexplained weakness.  Keep a diary of your activities, such as walking, doing chores, and taking medicine. It is very important to note what you were doing when you pushed the button to record your symptoms. This will help your health care provider determine what might be contributing to your symptoms.  Send the recorded information as recommended by your health care provider. It may take some time for your health care provider to process the results.  Change the batteries as told by your health care provider.  Keep electronic devices away from your monitor. These include: ? Tablets. ? MP3 players. ? Cell phones.  While wearing your monitor you should avoid: ? Electric blankets. ? Tax inspector. ? Electric toothbrushes. ? Microwave ovens. ? Magnets. ? Metal detectors. Get help right away if:  You have chest pain.  You have shortness of breath or extreme difficulty breathing.  You develop a very fast heartbeat that does not get better.  You develop dizziness that does not go away.  You faint or constantly feel like you are about to faint. Summary  An ambulatory cardiac monitor is a small recording device that is used to detect abnormal heart rhythms (arrhythmias).  Make sure you understand how to send the information from the monitor to your health care provider.  It is important to press the button on the monitor when you have any heart-related symptoms.  Keep a diary of your activities, such as walking, doing chores, and taking medicine. It is very important to note what you were doing when you pushed the button to record your symptoms. This will help your health care provider learn what might be causing  your symptoms. This information is not intended to replace advice given to you by your health care provider. Make sure you discuss any questions you have with your health care provider. Document Released: 03/15/2008 Document Revised: 03/22/2017 Document Reviewed: 05/21/2016 Elsevier Interactive Patient Education  2019 [employer].

## 2018-07-20 ENCOUNTER — Other Ambulatory Visit (HOSPITAL_BASED_OUTPATIENT_CLINIC_OR_DEPARTMENT_OTHER): Payer: Medicare Other

## 2018-07-23 ENCOUNTER — Other Ambulatory Visit: Payer: Self-pay | Admitting: Cardiology

## 2018-07-23 DIAGNOSIS — R001 Bradycardia, unspecified: Secondary | ICD-10-CM

## 2018-07-23 DIAGNOSIS — I441 Atrioventricular block, second degree: Secondary | ICD-10-CM

## 2018-07-23 DIAGNOSIS — R0789 Other chest pain: Secondary | ICD-10-CM

## 2018-07-30 ENCOUNTER — Ambulatory Visit (INDEPENDENT_AMBULATORY_CARE_PROVIDER_SITE_OTHER): Payer: Medicare Other

## 2018-07-30 ENCOUNTER — Other Ambulatory Visit: Payer: Self-pay | Admitting: *Deleted

## 2018-07-30 ENCOUNTER — Ambulatory Visit (HOSPITAL_BASED_OUTPATIENT_CLINIC_OR_DEPARTMENT_OTHER)
Admission: RE | Admit: 2018-07-30 | Discharge: 2018-07-30 | Disposition: A | Discharge status: Home / Self Care | Payer: Medicare Other | Source: Ambulatory Visit | Attending: Cardiology | Admitting: Cardiology

## 2018-07-30 DIAGNOSIS — I441 Atrioventricular block, second degree: Secondary | ICD-10-CM | POA: Diagnosis present

## 2018-07-30 DIAGNOSIS — R0789 Other chest pain: Secondary | ICD-10-CM | POA: Diagnosis present

## 2018-07-30 DIAGNOSIS — R001 Bradycardia, unspecified: Secondary | ICD-10-CM | POA: Diagnosis not present

## 2018-07-30 MED ORDER — AMLODIPINE BESYLATE 5 MG PO TABS: 5.0000 mg | ORAL_TABLET | Freq: Every day | ORAL | 1 refills | Status: DC

## 2018-08-06 ENCOUNTER — Telehealth: Payer: Self-pay

## 2018-08-06 NOTE — Telephone Encounter (Signed)
RN called patient with results and left a vm. Lorenda Hatchet consulted to try to get patient on EP doc schedule in next 2 weeks, per Dr. Geraldo Pitter.

## 2018-08-06 NOTE — Telephone Encounter (Signed)
-----   Message from Jenean Lindau, MD sent at 08/06/2018 12:25 PM EST ----- Patient needs urgent appointment with electrophysiology Jenean Lindau, MD 08/06/2018 12:24 PM

## 2018-08-07 NOTE — Telephone Encounter (Signed)
Patient returned call. Received results and is ok with referral to [facility]. RN relayed [address] address and appt time. No further questions at this time.

## 2018-08-07 NOTE — Telephone Encounter (Signed)
-----   Message from Jenean Lindau, MD sent at 08/06/2018 12:25 PM EST ----- Patient needs urgent appointment with electrophysiology Jenean Lindau, MD 08/06/2018 12:24 PM

## 2018-08-14 ENCOUNTER — Ambulatory Visit: Payer: Medicare Other | Admitting: Cardiology

## 2018-08-14 ENCOUNTER — Encounter: Payer: Self-pay | Admitting: Cardiology

## 2018-08-14 VITALS — BP 132/72 | HR 60 | Ht 71.0 in | Wt 171.0 lb

## 2018-08-14 DIAGNOSIS — I441 Atrioventricular block, second degree: Secondary | ICD-10-CM | POA: Diagnosis not present

## 2018-08-14 NOTE — Progress Notes (Signed)
Electrophysiology Office Note   Date:  08/14/2018   ID:  CHANCEY RINGEL, DOB January 10, 1946, MRN [id number]  PCP:  Seward Carol, MD  Cardiologist:  Revankar Primary Electrophysiologist:   Meredith Leeds, MD    No chief complaint on file.    History of Present Illness: David Rios is a 73 y.o. male who is being seen today for the evaluation of second degree AV block at the request of Sunny Schlein Revankar. Presenting today for electrophysiology evaluation.  He has a past history of hypertension.  He was incidentally found to have second-degree heart block.  He is quite active and walks on a regular basis.  He continues to work part-time.  He walks 3 days a week up to an hour and a half.  He has no chest pain, orthopnea, PND, palpitations, dizziness, or syncope.  Today, he denies symptoms of palpitations, chest pain, shortness of breath, orthopnea, PND, lower extremity edema, claudication, dizziness, presyncope, syncope, bleeding, or neurologic sequela. The patient is tolerating medications without difficulties.  He is unaware of his slow heart rates.  He is planning to increase his activity level to see if he becomes symptomatic.   Past Medical History:  Diagnosis Date  . Cancer ([place])   . Hypertension    Past Surgical History:  Procedure Laterality Date  . PROSTATE SURGERY       Current Outpatient Medications  Medication Sig Dispense Refill  . amLODipine (NORVASC) 5 MG tablet Take 1 tablet (5 mg total) by mouth daily. 30 tablet 1  . aspirin 81 MG tablet Take 81 mg by mouth daily.    Marland Kitchen lisinopril (PRINIVIL,ZESTRIL) 10 MG tablet TAKE 1 TABLET BY MOUTH DAILY  Office visit needed for 90 day supply 30 tablet 0  . Multiple Vitamin (MULTIVITAMIN) capsule Take 1 capsule by mouth daily.     No current facility-administered medications for this visit.     Allergies:   Patient has no known allergies.   Social History:  The patient  reports that he has never smoked. He has never used  smokeless tobacco. He reports that he does not drink alcohol.   Family History:  The patient's family history includes Hypertension in his brother, brother, brother, father, and mother.    ROS:  Please see the history of present illness.   Otherwise, review of systems is positive for palpitations.   All other systems are reviewed and negative.    PHYSICAL EXAM: VS:  There were no vitals taken for this visit. , BMI There is no height or weight on file to calculate BMI. GEN: Well nourished, well developed, in no acute distress  HEENT: normal  Neck: no JVD, carotid bruits, or masses Cardiac: Bradycardic, irregular; no murmurs, rubs, or gallops,no edema  Respiratory:  clear to auscultation bilaterally, normal work of breathing GI: soft, nontender, nondistended, + BS MS: no deformity or atrophy  Skin: warm and dry Neuro:  Strength and sensation are intact Psych: euthymic mood, full affect  EKG:  EKG is ordered today. Personal review of the ekg ordered shows sinus rhythm, Mobitz 1 AV block, rate 60  Recent Labs: No results found for requested labs within last 8760 hours.    Lipid Panel     Component Value Date/Time   CHOL 190 07/16/2012 1118   TRIG 60 07/16/2012 1118   HDL 46 07/16/2012 1118   CHOLHDL 4.1 07/16/2012 1118   VLDL 12 07/16/2012 1118   LDLCALC 132 (H) 07/16/2012 1118  Wt Readings from Last 3 Encounters:  07/12/18 173 lb (78.5 kg)  03/24/16 190 lb (86.2 kg)  01/15/15 188 lb 6.4 oz (85.5 kg)      Other studies Reviewed: Additional studies/ records that were reviewed today include: Stress TTE 07/31/18  Review of the above records today demonstrates:  Pre-stress testing the left ventricular systolic function was normal systolic function with a 63-81% ejection fraction. Post-stress testing, the left ventricular systolic function was hyperdynamic systolic function with a >77% ejection fraction. There were no stress-induced wall motion abnormalities. This is a  negative stress echo test for ischemia.  Holter 08/06/18 - personally reviewed Baseline rhythm: Sinus Minimum heart rate: 24 BPM at 4:03 AM.  Average heart rate: 45 BPM.  Maximal heart rate 118 BPM. Atrial arrhythmia: None significant.  Ventricular arrhythmia: None significant 7 beat nonsustained ventricular tachycardia. Conduction abnormality: Significant conduction disturbance with first-degree AV block, second-degree AV block at times.  Many times the QRS is normal at times it is prolonged with an appearance of bundle branch block   ASSESSMENT AND PLAN:  1.  Second-degree AV block: Has Mobitz 1 AV block on his ECG today.  Has had episodes of 2-1 AV block and is quite slow on auscultation.  Due to his Mobitz 1 heart block, I feel that his block is likely in the AV node.  It is likely okay to continue to monitor this.  I  see him back again in 3 months with an ECG.  Would avoid AV nodal blocking agents.  2.  Hypertension: Currently well controlled    Current medicines are reviewed at length with the patient today.   The patient does not have concerns regarding his medicines.  The following changes were made today:  none  Labs/ tests ordered today include:  No orders of the defined types were placed in this encounter.  Case discussed with primary cardiology  Disposition:   FU with   3 months  Signed,  Meredith Leeds, MD  08/14/2018 8:10 AM     [facility] [address] [place] [address] [phone] (office) [phone] (fax)

## 2018-08-14 NOTE — Patient Instructions (Signed)
Medication Instructions:    Your physician recommends that you continue on your current medications as directed. Please refer to the Current Medication list given to you today.  - If you need a refill on your cardiac medications before your next appointment, please call your pharmacy.   Labwork:  None ordered  Testing/Procedures:  None ordered  Follow-Up:  Your physician recommends that you schedule a follow-up appointment in: 3 months with Dr. Camnitz.  Thank you for choosing [facility]!!   Jonan Seufert, RN [phone]         

## 2018-09-10 ENCOUNTER — Ambulatory Visit: Payer: Medicare Other | Admitting: Cardiology

## 2018-09-28 ENCOUNTER — Other Ambulatory Visit: Payer: Self-pay | Admitting: Cardiology

## 2018-11-20 ENCOUNTER — Telehealth: Payer: Self-pay | Admitting: *Deleted

## 2018-11-20 NOTE — Telephone Encounter (Signed)
Called patient to verify appointment on 6/9 @ 9:30am.  OV will be an in office visit at our [place], [employer] location.  Unable to reach patient.

## 2018-11-26 ENCOUNTER — Telehealth: Payer: Self-pay | Admitting: Cardiology

## 2018-11-26 NOTE — Telephone Encounter (Signed)
New Message    Pt is calling because he cant make his appt with Dr Curt Bears tomorrow but would like to reschedule for an office visit in a couple of weeks    Please call back

## 2018-11-26 NOTE — Telephone Encounter (Signed)
Left detailed message for patient to come to office for appointment tomorrow

## 2018-11-27 ENCOUNTER — Ambulatory Visit: Payer: Medicare Other | Admitting: Cardiology

## 2018-12-05 ENCOUNTER — Other Ambulatory Visit: Payer: Self-pay | Admitting: Cardiology

## 2019-05-06 ENCOUNTER — Other Ambulatory Visit: Payer: Self-pay | Admitting: Cardiology

## 2019-06-04 ENCOUNTER — Other Ambulatory Visit: Payer: Self-pay | Admitting: Cardiology

## 2019-07-10 ENCOUNTER — Other Ambulatory Visit: Payer: Self-pay | Admitting: Cardiology

## 2019-08-20 DIAGNOSIS — C61 Malignant neoplasm of prostate: Secondary | ICD-10-CM | POA: Diagnosis not present

## 2019-09-03 ENCOUNTER — Other Ambulatory Visit: Payer: Self-pay | Admitting: Cardiology

## 2019-11-02 ENCOUNTER — Ambulatory Visit (HOSPITAL_COMMUNITY)
Admission: EM | Admit: 2019-11-02 | Discharge: 2019-11-02 | Disposition: A | Discharge status: Home / Self Care | Payer: Medicare HMO | Attending: Family Medicine | Admitting: Family Medicine

## 2019-11-02 ENCOUNTER — Encounter (HOSPITAL_COMMUNITY): Payer: Self-pay

## 2019-11-02 ENCOUNTER — Other Ambulatory Visit: Payer: Self-pay

## 2019-11-02 DIAGNOSIS — M542 Cervicalgia: Secondary | ICD-10-CM | POA: Diagnosis not present

## 2019-11-02 DIAGNOSIS — M545 Low back pain, unspecified: Secondary | ICD-10-CM

## 2019-11-02 DIAGNOSIS — S39012A Strain of muscle, fascia and tendon of lower back, initial encounter: Secondary | ICD-10-CM | POA: Diagnosis not present

## 2019-11-02 MED ORDER — IBUPROFEN 800 MG PO TABS: 800.0000 mg | ORAL_TABLET | Freq: Three times a day (TID) | ORAL | 0 refills | Status: AC | PRN

## 2019-11-02 MED ORDER — CYCLOBENZAPRINE HCL 10 MG PO TABS
10.0000 mg | ORAL_TABLET | Freq: Two times a day (BID) | ORAL | 0 refills | Status: DC | PRN
Start: 2019-11-02 — End: 2020-08-19
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
## 2019-11-02 NOTE — Discharge Instructions (Addendum)
Take the ibuprofen as prescribed.  Rest.  Apply ice packs 2-3 times a day for up to 20 minutes each.    Follow up with your primary care provider or an orthopedist if you symptoms continue or worsen;  Or if you develop new symptoms, such as numbness, tingling, or weakness.    If you are not feeling better over the next week, follow up with this office or with orthopedics
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
## 2019-12-03 DIAGNOSIS — C61 Malignant neoplasm of prostate: Secondary | ICD-10-CM | POA: Diagnosis not present

## 2020-03-24 DIAGNOSIS — C61 Malignant neoplasm of prostate: Secondary | ICD-10-CM | POA: Diagnosis not present

## 2020-03-31 ENCOUNTER — Other Ambulatory Visit: Payer: Self-pay | Admitting: Cardiology

## 2020-07-17 DIAGNOSIS — C61 Malignant neoplasm of prostate: Secondary | ICD-10-CM | POA: Diagnosis not present

## 2020-08-11 DIAGNOSIS — C61 Malignant neoplasm of prostate: Secondary | ICD-10-CM | POA: Diagnosis not present

## 2020-08-11 DIAGNOSIS — R001 Bradycardia, unspecified: Secondary | ICD-10-CM | POA: Diagnosis not present

## 2020-08-11 DIAGNOSIS — I441 Atrioventricular block, second degree: Secondary | ICD-10-CM | POA: Diagnosis not present

## 2020-08-11 DIAGNOSIS — I10 Essential (primary) hypertension: Secondary | ICD-10-CM | POA: Diagnosis not present

## 2020-08-18 DIAGNOSIS — I10 Essential (primary) hypertension: Secondary | ICD-10-CM | POA: Insufficient documentation

## 2020-08-18 DIAGNOSIS — C61 Malignant neoplasm of prostate: Secondary | ICD-10-CM | POA: Insufficient documentation

## 2020-08-18 DIAGNOSIS — I499 Cardiac arrhythmia, unspecified: Secondary | ICD-10-CM

## 2020-08-18 DIAGNOSIS — C801 Malignant (primary) neoplasm, unspecified: Secondary | ICD-10-CM | POA: Insufficient documentation

## 2020-08-18 HISTORY — DX: Cardiac arrhythmia, unspecified: I49.9

## 2020-08-19 ENCOUNTER — Other Ambulatory Visit: Payer: Self-pay

## 2020-08-19 ENCOUNTER — Ambulatory Visit: Payer: Medicare HMO | Admitting: Cardiology

## 2020-08-19 ENCOUNTER — Encounter: Payer: Self-pay | Admitting: Cardiology

## 2020-08-19 VITALS — BP 172/80 | HR 43 | Ht 70.0 in | Wt 171.4 lb

## 2020-08-19 DIAGNOSIS — I10 Essential (primary) hypertension: Secondary | ICD-10-CM | POA: Diagnosis not present

## 2020-08-19 DIAGNOSIS — I441 Atrioventricular block, second degree: Secondary | ICD-10-CM | POA: Diagnosis not present

## 2020-08-19 MED ORDER — LISINOPRIL 20 MG PO TABS: 20.0000 mg | ORAL_TABLET | Freq: Every day | ORAL | 3 refills | Status: DC

## 2020-08-19 NOTE — Progress Notes (Signed)
Cardiology Office Note:    Date:  08/19/2020   ID:  David Rios, DOB 1945/11/14, MRN [id number]  PCP:  Seward Carol, MD  Cardiologist:  Jenean Lindau, MD   Referring MD: Seward Carol, MD    ASSESSMENT:    1. AV block, 2nd degree   2. Essential hypertension    PLAN:    In order of problems listed above:  1. Primary prevention stressed with the patient.  Importance of compliance with diet medication stressed any vocalized understanding. 2. Essential hypertension: Blood pressure is elevated and he tells me that his blood pressure is elevated at home also.  I doubled his lisinopril. 3. AV conduction disturbances with AV block: Patient mentioned to me about his appointment with our electrophysiology colleague Dr. Curt Bears.  He is a little concerned about his slow heart rate and he wants a second opinion.  I will refer him to our electrophysiology colleagues for another opinion.  Today's EKG is also concerning.  He has slow heart rate.  I am not sure with he is asymptomatic but I asked him whether he had any dizziness or syncopal spells and he answers in the negative.  He has good effort tolerance.  I will respect his wishes and give him a second opinion with our electrophysiology colleague Dr. Lovena Le.  He be seen in follow-up appointment in a month or earlier if he has any concerns.  He knows to go to the nearest emergency room for any significant issues.   Medication Adjustments/Labs and Tests Ordered: Current medicines are reviewed at length with the patient today.  Concerns regarding medicines are outlined above.  No orders of the defined types were placed in this encounter.  No orders of the defined types were placed in this encounter.    No chief complaint on file.    History of Present Illness:    David Rios is a 75 y.o. male.  Patient has past medical history of essential hypertension, AV block which is significant and bradycardia.  Overall he mentions to me that  he is an active gentleman.  No chest pain orthopnea or PND.  No dizziness or syncopal spells.  At the time of my evaluation, the patient is alert awake oriented and in no distress.  Past Medical History:  Diagnosis Date  . Arrhythmia 08/18/2020  . ARTHRITIS 09/24/2007   Qualifier: Diagnosis of  By: Ronnald Ramp RN, CGRN, Sheri    . AV block, 2nd degree 07/12/2018  . 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 Cancer ([place])   . Chest discomfort 07/12/2018  . Essential hypertension 09/24/2007   Qualifier: Diagnosis of  By: Ronnald Ramp RN, [place], [place]    . HEMOCCULT POSITIVE STOOL 09/24/2007   Qualifier: Diagnosis of  By: Ronnald Ramp RN, CGRN, Sheri    . Hypertension   . PROSTATE CANCER, HX OF 09/24/2007   Qualifier: Diagnosis of  By: Ronnald Ramp RN, Lorenda Cahill      Past Surgical History:  Procedure Laterality Date  . PROSTATE SURGERY      Current Medications: Current Meds  Medication Sig  . aspirin 81 MG tablet Take 81 mg by mouth daily.  Marland Kitchen ibuprofen (ADVIL) 800 MG tablet Take 1 tablet (800 mg total) by mouth every 8 (eight) hours as needed for moderate pain.  Marland Kitchen  lisinopril (ZESTRIL) 10 MG tablet Take 10 mg by mouth daily.  . Multiple Vitamin (MULTIVITAMIN) capsule Take 1 capsule by mouth daily.  . vitamin C (ASCORBIC ACID) 500 MG tablet Take 500 mg by mouth once a week.     Allergies:   Patient has no known allergies.   Social History   Socioeconomic History  . Marital status: Married    Spouse name: Not on file  . Number of children: Not on file  . Years of education: Not on file  . Highest education level: Not on file  Occupational History  . Not on file  Tobacco Use  . Smoking status: Never Smoker  . Smokeless tobacco: Never Used  Substance and Sexual Activity  . Alcohol use: No  . Drug use: Not  on file  . Sexual activity: Yes  Other Topics Concern  . Not on file  Social History Narrative  . Not on file   Social Determinants of Health   Financial Resource Strain: Not on file  Food Insecurity: Not on file  Transportation Needs: Not on file  Physical Activity: Not on file  Stress: Not on file  Social Connections: Not on file     Family History: The patient's family history includes Hypertension in his brother, brother, brother, father, and mother; Kidney disease in his sister; Stroke in his father.  ROS:   Please see the history of present illness.    All other systems reviewed and are negative.  EKGs/Labs/Other Studies Reviewed:    The following studies were reviewed today: EKG reveals sinus rhythm with AV block.  Narrow complex QRS.   Recent Labs: No results found for requested labs within last 8760 hours.  Recent Lipid Panel    Component Value Date/Time   CHOL 190 07/16/2012 1118   TRIG 60 07/16/2012 1118   HDL 46 07/16/2012 1118   CHOLHDL 4.1 07/16/2012 1118   VLDL 12 07/16/2012 1118   LDLCALC 132 (H) 07/16/2012 1118    Physical Exam:    VS:  BP (!) 172/80   Pulse (!) 43   Ht 5\' 10"  (1.778 m)   Wt 171 lb 6.4 oz (77.7 kg)   SpO2 97%   BMI 24.59 kg/m     Wt Readings from Last 3 Encounters:  08/19/20 171 lb 6.4 oz (77.7 kg)  08/14/18 171 lb (77.6 kg)  07/12/18 173 lb (78.5 kg)     GEN: Patient is in no acute distress HEENT: Normal NECK: No JVD; No carotid bruits LYMPHATICS: No lymphadenopathy CARDIAC: Hear sounds regular, 2/6 systolic murmur at the apex. RESPIRATORY:  Clear to auscultation without rales, wheezing or rhonchi  ABDOMEN: Soft, non-tender, non-distended MUSCULOSKELETAL:  No edema; No deformity  SKIN: Warm and dry NEUROLOGIC:  Alert and oriented x 3 PSYCHIATRIC:  Normal affect   Signed, Jenean Lindau, MD  08/19/2020 1:16 PM    [facility]

## 2020-08-19 NOTE — Patient Instructions (Signed)

## 2020-09-21 ENCOUNTER — Telehealth: Payer: Self-pay | Admitting: Internal Medicine

## 2020-09-22 ENCOUNTER — Institutional Professional Consult (permissible substitution): Payer: Medicare HMO | Admitting: Internal Medicine

## 2020-10-26 DIAGNOSIS — C61 Malignant neoplasm of prostate: Secondary | ICD-10-CM | POA: Diagnosis not present

## 2020-10-27 ENCOUNTER — Encounter: Payer: Self-pay | Admitting: Internal Medicine

## 2020-10-27 ENCOUNTER — Other Ambulatory Visit: Payer: Self-pay

## 2020-10-27 ENCOUNTER — Ambulatory Visit: Payer: Medicare HMO | Admitting: Internal Medicine

## 2020-10-27 VITALS — BP 190/76 | HR 38 | Ht 70.0 in | Wt 174.8 lb

## 2020-10-27 DIAGNOSIS — I441 Atrioventricular block, second degree: Secondary | ICD-10-CM

## 2020-10-27 NOTE — Patient Instructions (Addendum)
Medication Instructions:  Your physician recommends that you continue on your current medications as directed. Please refer to the Current Medication list given to you today.  Labwork: None ordered.  Testing/Procedures: None ordered.  Follow-Up: Your physician wants you to follow-up in: as needed with Gregg Taylor, MD    Any Other Special Instructions Will Be Listed Below (If Applicable).  If you need a refill on your cardiac medications before your next appointment, please call your pharmacy.       

## 2020-10-27 NOTE — Progress Notes (Signed)
HPI [name] is referred today by Dr. Geraldo Pitter for evaluation of bradycardia. 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 He has a h/o HTN and bradycardia. He underwent a stress test 2 years ago and got his HR up to 150/min. He has not had syncope. His resting HR is low in the 30's and 40's but he is able to work a vigorously stressful job. He denies chest pain. He has document AV block, at times 2:1 despite being awake.  No Known Allergies   Current Outpatient Medications  Medication Sig Dispense Refill  . aspirin 81 MG tablet Take 81 mg by mouth daily.    Marland Kitchen ibuprofen (ADVIL) 800 MG tablet Take 1 tablet (800 mg total) by mouth every 8 (eight) hours as needed for moderate pain. 21 tablet 0  . lisinopril (ZESTRIL) 20 MG tablet Take 1 tablet (20 mg total) by mouth daily. 90 tablet 3  . Multiple Vitamin (MULTIVITAMIN) capsule Take 1 capsule by mouth daily.    . vitamin C (ASCORBIC ACID) 500 MG tablet Take 500 mg by mouth once a week.     No current facility-administered medications for this visit.     Past Medical History:  Diagnosis Date  . Arrhythmia 08/18/2020  . ARTHRITIS 09/24/2007   Qualifier: Diagnosis of  By: Ronnald Ramp RN, CGRN, Sheri    . AV block, 2nd degree 07/12/2018  . 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 Cancer ([place])   . Chest discomfort 07/12/2018  . Essential hypertension 09/24/2007   Qualifier: Diagnosis of  By: Ronnald Ramp RN, [place], [place]    . HEMOCCULT POSITIVE STOOL 09/24/2007   Qualifier: Diagnosis of  By: Ronnald Ramp RN, CGRN, Sheri    . Hypertension   . PROSTATE CANCER, HX OF 09/24/2007   Qualifier: Diagnosis of  By: Ronnald Ramp RN, CGRN, Sheri      ROS:   All systems reviewed and negative except as noted in the HPI.   Past Surgical  History:  Procedure Laterality Date  . PROSTATE SURGERY       Family History  Problem Relation Age of Onset  . Hypertension Mother   . Hypertension Father   . Stroke Father   . Kidney disease Sister        dialysis  . Hypertension Brother   . Hypertension Brother   . Hypertension Brother      Social History   Socioeconomic History  . Marital status: Married    Spouse name: Not on file  . Number of children: Not on file  . Years of education: Not on file  . Highest education level: Not on file  Occupational History  . Not on file  Tobacco Use  . Smoking status: Never Smoker  . Smokeless tobacco: Never Used  Substance and Sexual Activity  . Alcohol use: No  . Drug use: Not on file  . Sexual activity: Yes  Other Topics Concern  . Not on file  Social History Narrative  . Not on file   Social Determinants of Health   Financial Resource Strain: Not on file  Food Insecurity: Not on file  Transportation Needs: Not on file  Physical Activity: Not on file  Stress: Not on file  Social Connections: Not on  file  Intimate Partner Violence: Not on file     BP (!) 190/76   Pulse (!) 38   Ht 5\' 10"  (1.778 m)   Wt 174 lb 12.8 oz (79.3 kg)   SpO2 96%   BMI 25.08 kg/m   Physical Exam:  Well appearing NAD HEENT: Unremarkable Neck:  No JVD, no thyromegally Lymphatics:  No adenopathy Back:  No CVA tenderness Lungs:  Clear with no wheezes HEART:  Regular rate rhythm, no murmurs, no rubs, no clicks Abd:  soft, positive bowel sounds, no organomegally, no rebound, no guarding Ext:  2 plus pulses, no edema, no cyanosis, no clubbing Skin:  No rashes no nodules Neuro:  CN II through XII intact, motor grossly intact  EKG - reviewed NSR with 2:1 AV block   Assess/Plan: 1. Heart block - he is currently asymptomatic and his heart appears to increase appropriately with exertion based on his ability to work as well as the 2D stress echo he did 2 years ago. I would recommend  watchful waiting at this point and I carefully layed out the symptoms he might experience if his conduction problems were to worsen. 2. HTN - his bradycardia makes control of his bp more difficult. He could try amlodipine and a diuretice. I will defer to Dr. Geraldo Pitter.  Carleene Overlie Taysom Glymph,MD

## 2021-01-15 DIAGNOSIS — H2513 Age-related nuclear cataract, bilateral: Secondary | ICD-10-CM | POA: Diagnosis not present

## 2021-01-15 DIAGNOSIS — H40033 Anatomical narrow angle, bilateral: Secondary | ICD-10-CM | POA: Diagnosis not present

## 2021-01-18 DIAGNOSIS — H524 Presbyopia: Secondary | ICD-10-CM | POA: Diagnosis not present

## 2021-01-18 DIAGNOSIS — H52222 Regular astigmatism, left eye: Secondary | ICD-10-CM | POA: Diagnosis not present

## 2021-02-26 DIAGNOSIS — C61 Malignant neoplasm of prostate: Secondary | ICD-10-CM | POA: Diagnosis not present

## 2021-03-05 ENCOUNTER — Ambulatory Visit: Payer: Medicare HMO | Admitting: Cardiology

## 2021-06-01 DIAGNOSIS — C61 Malignant neoplasm of prostate: Secondary | ICD-10-CM | POA: Diagnosis not present

## 2021-08-14 ENCOUNTER — Other Ambulatory Visit: Payer: Self-pay | Admitting: Cardiology

## 2021-08-28 ENCOUNTER — Other Ambulatory Visit: Payer: Self-pay | Admitting: Cardiology

## 2021-09-23 DIAGNOSIS — C61 Malignant neoplasm of prostate: Secondary | ICD-10-CM | POA: Diagnosis not present

## 2021-09-25 ENCOUNTER — Other Ambulatory Visit: Payer: Self-pay | Admitting: Cardiology

## 2021-10-29 DIAGNOSIS — I10 Essential (primary) hypertension: Secondary | ICD-10-CM | POA: Diagnosis not present

## 2021-10-29 DIAGNOSIS — R0981 Nasal congestion: Secondary | ICD-10-CM | POA: Diagnosis not present

## 2021-10-29 DIAGNOSIS — I441 Atrioventricular block, second degree: Secondary | ICD-10-CM | POA: Diagnosis not present

## 2021-11-19 DIAGNOSIS — I10 Essential (primary) hypertension: Secondary | ICD-10-CM | POA: Diagnosis not present

## 2022-01-07 DIAGNOSIS — C61 Malignant neoplasm of prostate: Secondary | ICD-10-CM | POA: Diagnosis not present

## 2022-01-29 DIAGNOSIS — R1084 Generalized abdominal pain: Secondary | ICD-10-CM | POA: Diagnosis not present

## 2022-01-29 DIAGNOSIS — R197 Diarrhea, unspecified: Secondary | ICD-10-CM | POA: Diagnosis not present

## 2022-01-29 DIAGNOSIS — R051 Acute cough: Secondary | ICD-10-CM | POA: Diagnosis not present

## 2022-01-29 DIAGNOSIS — R0981 Nasal congestion: Secondary | ICD-10-CM | POA: Diagnosis not present

## 2022-04-21 DIAGNOSIS — C61 Malignant neoplasm of prostate: Secondary | ICD-10-CM | POA: Diagnosis not present

## 2022-08-04 DIAGNOSIS — Z1331 Encounter for screening for depression: Secondary | ICD-10-CM | POA: Diagnosis not present

## 2022-08-04 DIAGNOSIS — I10 Essential (primary) hypertension: Secondary | ICD-10-CM | POA: Diagnosis not present

## 2022-08-04 DIAGNOSIS — Z0000 Encounter for general adult medical examination without abnormal findings: Secondary | ICD-10-CM | POA: Diagnosis not present

## 2022-08-04 DIAGNOSIS — C61 Malignant neoplasm of prostate: Secondary | ICD-10-CM | POA: Diagnosis not present

## 2022-08-04 DIAGNOSIS — I441 Atrioventricular block, second degree: Secondary | ICD-10-CM | POA: Diagnosis not present

## 2022-08-04 DIAGNOSIS — N1831 Chronic kidney disease, stage 3a: Secondary | ICD-10-CM | POA: Diagnosis not present

## 2022-08-05 DIAGNOSIS — C61 Malignant neoplasm of prostate: Secondary | ICD-10-CM | POA: Diagnosis not present

## 2022-11-03 DIAGNOSIS — C61 Malignant neoplasm of prostate: Secondary | ICD-10-CM | POA: Diagnosis not present

## 2023-02-16 DIAGNOSIS — C61 Malignant neoplasm of prostate: Secondary | ICD-10-CM | POA: Diagnosis not present

## 2023-06-05 DIAGNOSIS — C61 Malignant neoplasm of prostate: Secondary | ICD-10-CM | POA: Diagnosis not present

## 2023-06-13 DIAGNOSIS — R42 Dizziness and giddiness: Secondary | ICD-10-CM | POA: Diagnosis not present

## 2023-06-13 DIAGNOSIS — I10 Essential (primary) hypertension: Secondary | ICD-10-CM | POA: Diagnosis not present

## 2023-06-13 DIAGNOSIS — J029 Acute pharyngitis, unspecified: Secondary | ICD-10-CM | POA: Diagnosis not present

## 2023-06-13 DIAGNOSIS — R001 Bradycardia, unspecified: Secondary | ICD-10-CM | POA: Diagnosis not present

## 2023-08-23 DIAGNOSIS — N1831 Chronic kidney disease, stage 3a: Secondary | ICD-10-CM | POA: Diagnosis not present

## 2023-08-23 DIAGNOSIS — I441 Atrioventricular block, second degree: Secondary | ICD-10-CM | POA: Diagnosis not present

## 2023-08-23 DIAGNOSIS — I10 Essential (primary) hypertension: Secondary | ICD-10-CM | POA: Diagnosis not present

## 2023-08-23 DIAGNOSIS — Z23 Encounter for immunization: Secondary | ICD-10-CM | POA: Diagnosis not present

## 2023-08-23 DIAGNOSIS — Z8546 Personal history of malignant neoplasm of prostate: Secondary | ICD-10-CM | POA: Diagnosis not present

## 2023-08-28 DIAGNOSIS — I499 Cardiac arrhythmia, unspecified: Secondary | ICD-10-CM | POA: Diagnosis not present

## 2023-08-28 DIAGNOSIS — I4892 Unspecified atrial flutter: Secondary | ICD-10-CM | POA: Diagnosis not present

## 2023-09-28 DIAGNOSIS — C61 Malignant neoplasm of prostate: Secondary | ICD-10-CM | POA: Diagnosis not present

## 2024-01-22 DIAGNOSIS — C61 Malignant neoplasm of prostate: Secondary | ICD-10-CM | POA: Diagnosis not present
